# Patient Record
Sex: Female | Born: 1985 | Race: Black or African American | Hispanic: No | Marital: Single | Smoking: Current some day smoker
Health system: Southern US, Community
[De-identification: ages and names within clinical notes are randomized; demographics above are authoritative.]

## PROBLEM LIST (undated history)

## (undated) DIAGNOSIS — E559 Vitamin D deficiency, unspecified: Secondary | ICD-10-CM

## (undated) DIAGNOSIS — D649 Anemia, unspecified: Secondary | ICD-10-CM

---

## 2014-06-29 DIAGNOSIS — L732 Hidradenitis suppurativa: Secondary | ICD-10-CM | POA: Insufficient documentation

## 2014-09-29 DIAGNOSIS — E669 Obesity, unspecified: Secondary | ICD-10-CM | POA: Insufficient documentation

## 2015-11-15 ENCOUNTER — Emergency Department (HOSPITAL_COMMUNITY)
Admission: EM | Admit: 2015-11-15 | Discharge: 2015-11-15 | Disposition: A | Discharge status: Home / Self Care | Payer: Medicaid Other | Attending: Emergency Medicine | Admitting: Emergency Medicine

## 2015-11-15 ENCOUNTER — Encounter (HOSPITAL_COMMUNITY): Payer: Self-pay | Admitting: *Deleted

## 2015-11-15 DIAGNOSIS — L509 Urticaria, unspecified: Secondary | ICD-10-CM

## 2015-11-15 DIAGNOSIS — L500 Allergic urticaria: Secondary | ICD-10-CM | POA: Diagnosis not present

## 2015-11-15 DIAGNOSIS — F17200 Nicotine dependence, unspecified, uncomplicated: Secondary | ICD-10-CM | POA: Insufficient documentation

## 2015-11-15 DIAGNOSIS — T7840XA Allergy, unspecified, initial encounter: Secondary | ICD-10-CM

## 2015-11-15 HISTORY — DX: Vitamin D deficiency, unspecified: E55.9

## 2015-11-15 HISTORY — DX: Anemia, unspecified: D64.9

## 2015-11-15 MED ORDER — PREDNISONE 20 MG PO TABS
60.0000 mg | ORAL_TABLET | Freq: Once | ORAL | Status: AC
  Administered 2015-11-15: 60 mg via ORAL
  Filled 2015-11-15: qty 3

## 2015-11-15 MED ORDER — CETIRIZINE HCL 10 MG PO TABS: 10.0000 mg | ORAL_TABLET | Freq: Every day | ORAL | 0 refills | Status: DC

## 2015-11-15 MED ORDER — PREDNISONE 20 MG PO TABS: 20.0000 mg | ORAL_TABLET | Freq: Two times a day (BID) | ORAL | 0 refills | Status: DC

## 2015-11-15 MED ORDER — FLUTICASONE PROPIONATE 50 MCG/ACT NA SUSP: NASAL | 1 refills | Status: DC

## 2015-11-15 NOTE — ED Triage Notes (Signed)
PT states moved to new house 20 days ago and for the last 15, some part of her face has been swelling, intermittently (ears, then lips, now R eye).  Pt also feels like theres a "knot in her throat".  Airway patent, presently.

## 2015-11-15 NOTE — ED Provider Notes (Signed)
[facility]-EMERGENCY DEPT Provider Note   CSN: [id number] Arrival date & time: 11/15/15  0745     History   Chief Complaint Chief Complaint  Patient presents with  . Allergic Reaction    HPI Susan Fuentes is a 30 y.o. female. She presents for evaluation of facial swelling. States she moved into new house about 3 weeks ago. Within a few days started having episodes where she would notice "things are swollen". When they her lip was swollen. A few days later her ears were both swollen. Seems to come and go. This morning she has some swelling to her right eyelid, and her upper lip. Denies any difficulty breathing. However she states that she had a slight cough this morning and felt "like a lump "in her throat. Her children home and not having symptoms. She has no new exposures. No medications. No detergents, soaps, perfumes, colognes, cleaning products.  No bites or stings. No dyspnea. Has a history of asthma and tried her inhaler this morning states it did not help.  HPI  Past Medical History:  Diagnosis Date  . Anemia   . Vitamin D deficiency     There are no active problems to display for this patient.   History reviewed. No pertinent surgical history.  OB History    No data available       Home Medications    Prior to Admission medications   Medication Sig Start Date End Date Taking? Authorizing Provider  cetirizine (ZYRTEC) 10 MG tablet Take 1 tablet (10 mg total) by mouth daily. 1 po q day prn allergies 11/15/15   Rolland Porter, MD  fluticasone [facility]) 50 MCG/ACT nasal spray 1 spray each nares bid 11/15/15   Rolland Porter, MD  predniSONE (DELTASONE) 20 MG tablet Take 1 tablet (20 mg total) by mouth 2 (two) times daily with a meal. 11/15/15   Rolland Porter, MD    Family History No family history on file.  Social History Social History  Substance Use Topics  . Smoking status: Current Some Day Smoker  . Smokeless tobacco: Never Used  . Alcohol use Yes     Comment: occ      Allergies   Review of patient's allergies indicates no known allergies.   Review of Systems Review of Systems  Constitutional: Negative for appetite change, chills, diaphoresis, fatigue and fever.  HENT: Negative for mouth sores, sore throat and trouble swallowing.        Eyelid and lip swelling  Eyes: Negative for visual disturbance.  Respiratory: Negative for cough, chest tightness, shortness of breath and wheezing.   Cardiovascular: Negative for chest pain.  Gastrointestinal: Negative for abdominal distention, abdominal pain, diarrhea, nausea and vomiting.  Endocrine: Negative for polydipsia, polyphagia and polyuria.  Genitourinary: Negative for dysuria, frequency and hematuria.  Musculoskeletal: Negative for gait problem.  Skin: Negative for color change, pallor and rash.  Neurological: Negative for dizziness, syncope, light-headedness and headaches.  Hematological: Does not bruise/bleed easily.  Psychiatric/Behavioral: Negative for behavioral problems and confusion.     Physical Exam Updated Vital Signs BP 117/85 (BP Location: Right Arm)   Pulse 93   Temp 98.5 F (36.9 C) (Oral)   Resp 18   Ht 5' (1.524 m)   Wt 162 lb (73.5 kg)   LMP 11/12/2015   SpO2 99%   BMI 31.64 kg/m   Physical Exam  Constitutional: She is oriented to person, place, and time. She appears well-developed and well-nourished. No distress.  HENT:  Head: Normocephalic.  Soft tissue swelling to right upper lid. Soft tissue swelling to left upper lip. Posterior pharynx normal. Normal uvula. Neck without stridor. No dyspnea. No conjunctival injection. No swelling of the nasal mucosa. Ears normal.  Eyes: Conjunctivae are normal. Pupils are equal, round, and reactive to light. No scleral icterus.  Neck: Normal range of motion. Neck supple. No thyromegaly present.  Cardiovascular: Normal rate and regular rhythm.  Exam reveals no gallop and no friction rub.   No murmur heard. Pulmonary/Chest: Effort  normal and breath sounds normal. No respiratory distress. She has no wheezes. She has no rales.  Abdominal: Soft. Bowel sounds are normal. She exhibits no distension. There is no tenderness. There is no rebound.  Musculoskeletal: Normal range of motion.  Neurological: She is alert and oriented to person, place, and time.  Skin: Skin is warm and dry. No rash noted.  No urticaria  Psychiatric: She has a normal mood and affect. Her behavior is normal.     ED Treatments / Results  Labs (all labs ordered are listed, but only abnormal results are displayed) Labs Reviewed - No data to display  EKG  EKG Interpretation None       Radiology No results found.  Procedures Procedures (including critical care time)  Medications Ordered in ED Medications  predniSONE (DELTASONE) tablet 60 mg (not administered)     Initial Impression / Assessment and Plan / ED Course  I have reviewed the triage vital signs and the nursing notes.  Pertinent labs & imaging results that were available during my care of the patient were reviewed by me and considered in my medical decision making (see chart for details).  Clinical Course    Given by mouth prednisone. Will try daily Zyrtec, 5 day prednisone, Flonase. Primary care follow-up if not improving ER with any worsening symptoms.  Final Clinical Impressions(s) / ED Diagnoses   Final diagnoses:  Urticaria  Allergic reaction, initial encounter    New Prescriptions New Prescriptions   CETIRIZINE (ZYRTEC) 10 MG TABLET    Take 1 tablet (10 mg total) by mouth daily. 1 po q day prn allergies   FLUTICASONE (FLONASE) 50 MCG/ACT NASAL SPRAY    1 spray each nares bid   PREDNISONE (DELTASONE) 20 MG TABLET    Take 1 tablet (20 mg total) by mouth 2 (two) times daily with a meal.     Rolland PorterMark Carime Dinkel, MD 11/15/15 [phone]

## 2015-11-15 NOTE — ED Notes (Signed)
Pt. Walked back to room by Malachi BondsIda, NT.

## 2015-11-15 NOTE — ED Triage Notes (Signed)
Pt speaking in complete sentences at nurse first. 

## 2015-11-15 NOTE — Discharge Instructions (Signed)
Prednisone prescription for the next 5 days. Claritin daily. Flonase inhaler twice per day. Return to ER with any difficult breathing worsening symptoms. Primary care follow-up. If this becomes a recurrent issue you may need allergy skin testing

## 2016-10-07 ENCOUNTER — Encounter (HOSPITAL_COMMUNITY): Payer: Self-pay | Admitting: Emergency Medicine

## 2016-10-07 ENCOUNTER — Emergency Department (HOSPITAL_COMMUNITY)
Admission: EM | Admit: 2016-10-07 | Discharge: 2016-10-07 | Disposition: A | Discharge status: Home / Self Care | Payer: Medicaid Other | Attending: Emergency Medicine | Admitting: Emergency Medicine

## 2016-10-07 DIAGNOSIS — F17200 Nicotine dependence, unspecified, uncomplicated: Secondary | ICD-10-CM | POA: Diagnosis not present

## 2016-10-07 DIAGNOSIS — R197 Diarrhea, unspecified: Secondary | ICD-10-CM | POA: Diagnosis not present

## 2016-10-07 DIAGNOSIS — R109 Unspecified abdominal pain: Secondary | ICD-10-CM | POA: Diagnosis present

## 2016-10-07 DIAGNOSIS — Z79899 Other long term (current) drug therapy: Secondary | ICD-10-CM | POA: Insufficient documentation

## 2016-10-07 DIAGNOSIS — R112 Nausea with vomiting, unspecified: Secondary | ICD-10-CM | POA: Diagnosis not present

## 2016-10-07 LAB — COMPREHENSIVE METABOLIC PANEL
ALT: 13 U/L — ABNORMAL LOW (ref 14–54)
AST: 20 U/L (ref 15–41)
Albumin: 3.8 g/dL (ref 3.5–5.0)
Alkaline Phosphatase: 42 U/L (ref 38–126)
Anion gap: 8 (ref 5–15)
BILIRUBIN TOTAL: 0.4 mg/dL (ref 0.3–1.2)
BUN: 14 mg/dL (ref 6–20)
CALCIUM: 8.9 mg/dL (ref 8.9–10.3)
CO2: 25 mmol/L (ref 22–32)
Chloride: 106 mmol/L (ref 101–111)
Creatinine, Ser: 0.82 mg/dL (ref 0.44–1.00)
GFR calc Af Amer: >60 mL/min (ref >60)
Glucose, Bld: 92 mg/dL (ref 65–99)
POTASSIUM: 3.5 mmol/L (ref 3.5–5.1)
Sodium: 139 mmol/L (ref 135–145)
Total Protein: 7 g/dL (ref 6.5–8.1)

## 2016-10-07 LAB — URINALYSIS, ROUTINE W REFLEX MICROSCOPIC
Bilirubin Urine: NEGATIVE
Glucose, UA: NEGATIVE mg/dL
Hgb urine dipstick: NEGATIVE
Ketones, ur: NEGATIVE mg/dL
NITRITE: NEGATIVE
PH: 8 (ref 5.0–8.0)
Protein, ur: 100 mg/dL — AB
SPECIFIC GRAVITY, URINE: 1.03 (ref 1.005–1.030)

## 2016-10-07 LAB — CBC
HCT: 35.9 % — ABNORMAL LOW (ref 36.0–46.0)
Hemoglobin: 11.6 g/dL — ABNORMAL LOW (ref 12.0–15.0)
MCH: 27.8 pg (ref 26.0–34.0)
MCHC: 32.3 g/dL (ref 30.0–36.0)
MCV: 85.9 fL (ref 78.0–100.0)
Platelets: 329 10*3/uL (ref 150–400)
RBC: 4.18 MIL/uL (ref 3.87–5.11)
RDW: 13.6 % (ref 11.5–15.5)
WBC: 9.6 10*3/uL (ref 4.0–10.5)

## 2016-10-07 LAB — WET PREP, GENITAL
SPERM: NONE SEEN
TRICH WET PREP: NONE SEEN
YEAST WET PREP: NONE SEEN

## 2016-10-07 LAB — POC URINE PREG, ED: Preg Test, Ur: NEGATIVE

## 2016-10-07 LAB — LIPASE, BLOOD: Lipase: 37 U/L (ref 11–51)

## 2016-10-07 MED ORDER — SUCRALFATE 1 G PO TABS: 1.0000 g | ORAL_TABLET | Freq: Three times a day (TID) | ORAL | 0 refills | Status: DC

## 2016-10-07 MED ORDER — ONDANSETRON 4 MG PO TBDP
ORAL_TABLET | ORAL | Status: AC
  Filled 2016-10-07: qty 1

## 2016-10-07 MED ORDER — ONDANSETRON 4 MG PO TBDP
4.0000 mg | ORAL_TABLET | Freq: Once | ORAL | Status: AC | PRN
  Administered 2016-10-07: 4 mg via ORAL

## 2016-10-07 MED ORDER — ONDANSETRON 8 MG PO TBDP: 8.0000 mg | ORAL_TABLET | Freq: Three times a day (TID) | ORAL | 0 refills | Status: DC | PRN

## 2016-10-07 MED ORDER — GI COCKTAIL ~~LOC~~
30.0000 mL | Freq: Once | ORAL | Status: AC
  Administered 2016-10-07: 30 mL via ORAL
  Filled 2016-10-07: qty 30

## 2016-10-07 NOTE — ED Triage Notes (Signed)
Pt with ab pain, nausea, vomiting and diarrhea after night of heavy alcohol consumption. No blood in emesis. NAD. Pain 6/10. No meds PTA.

## 2016-10-07 NOTE — Discharge Instructions (Signed)
Take carafate as prescribed for abdominal pain. Take zofran for nausea as prescribed as needed. Follow up with family doctor.

## 2016-10-07 NOTE — ED Provider Notes (Signed)
[facility]-EMER[facility]Y DEPT Provider Note   CSN: [id number] Arrival date & time: 10/07/16  1510  By signing my name below, I, Ny'Kea Lewis, attest that this documentation has been prepared under the direction and in the presence of Maybell Misenheimer, PA-C. Electronically Signed: Karren Cobble, ED Scribe. 10/07/16. 6:44 PM.  History   Chief Complaint Chief Complaint  Patient presents with  . Abdominal Pain  . Diarrhea  . Emesis   The history is provided by the patient. No language interpreter was used.   HPI Comments: Susan Fuentes is a 31 y.o. female with a PMHx of anemia, who presents to the Emergency Department complaining of sudden onset, intermittent, lower abdominal pain that began two days ago. She notes associated diarrhea, nausea, and vomiting for the past two days. Pt states last night she was celebrating with her friend and she had some alcohol more than she normally drinks, and since then she has been having continuous diarrhea, nausea, and vomiting. Two days prior to she reports her intial pain began, but it started to improve. After consuming alcohol she reports her symptoms worsened. Her abdominal pain is exacerbated with lying flat and alleviated with sitting straight up. Pt reports her menstrual has not started yet but states her cycles are abnormal. No treatment tried PTA. At this time she states she is not nauseous. She denies dysuria, urinary frequency/ urgency, or vaginal discharge.    Past Medical History:  Diagnosis Date  . Anemia   . Vitamin D deficiency    There are no active problems to display for this patient.  History reviewed. No pertinent surgical history.  OB History    No data available     Home Medications    Prior to Admission medications   Medication Sig Start Date End Date Taking? Authorizing Provider  cetirizine (ZYRTEC) 10 MG tablet Take 1 tablet (10 mg total) by mouth daily. 1 po q day prn allergies 11/15/15   Rolland Porter, MD  fluticasone [facility])  50 MCG/ACT nasal spray 1 spray each nares bid 11/15/15   Rolland Porter, MD  predniSONE (DELTASONE) 20 MG tablet Take 1 tablet (20 mg total) by mouth 2 (two) times daily with a meal. 11/15/15   Rolland Porter, MD   Family History No family history on file.  Social History Social History  Substance Use Topics  . Smoking status: Current Some Day Smoker  . Smokeless tobacco: Never Used  . Alcohol use Yes     Comment: occ    Allergies   Patient has no known allergies.  Review of Systems Review of Systems  Gastrointestinal: Positive for abdominal pain, diarrhea, nausea and vomiting.  Genitourinary: Negative for dysuria, frequency, hematuria, urgency and vaginal discharge.  Neurological: Positive for headaches.   Physical Exam Updated Vital Signs BP 109/65 (BP Location: Right Arm)   Pulse 83   Temp 98.5 F (36.9 C) (Oral)   Resp 16   Wt 168 lb (76.2 kg)   LMP 09/08/2016 (Approximate)   SpO2 100%   BMI 32.81 kg/m   Physical Exam  Constitutional: She is oriented to person, place, and time. She appears well-developed and well-nourished. No distress.  HENT:  Head: Normocephalic.  Eyes: Conjunctivae and EOM are normal.  Neck: Normal range of motion. Neck supple.  Cardiovascular: Normal rate, regular rhythm and normal heart sounds.   Pulmonary/Chest: Effort normal and breath sounds normal. No respiratory distress. She has no wheezes. She has no rales.  Abdominal: Soft. Bowel sounds are normal. She exhibits  no distension. There is tenderness. There is no rebound.  Suprapubic, and epigastric tenderness  Genitourinary:  Genitourinary Comments: Chaperone present. Normal external genitalia. Normal vaginal canal. Small thin white discharge. Cervix is normal, closed. No CMT. No uterine or adnexal tenderness. No masses palpated.    Musculoskeletal: Normal range of motion. She exhibits no edema.  Neurological: She is alert and oriented to person, place, and time.  Skin: Skin is warm and dry.    Psychiatric: She has a normal mood and affect. Her behavior is normal.  Nursing note and vitals reviewed.   ED Treatments / Results  DIAGNOSTIC STUDIES: Oxygen Saturation is 100% on RA, normal by my interpretation.   COORDINATION OF CARE: 6:28 PM-Discussed next steps with pt. Pt verbalized understanding and is agreeable with the plan.   Labs (all labs ordered are listed, but only abnormal results are displayed) Labs Reviewed  COMPREHENSIVE METABOLIC PANEL - Abnormal; Notable for the following:       Result Value   ALT 13 (*)    All other components within normal limits  CBC - Abnormal; Notable for the following:    Hemoglobin 11.6 (*)    HCT 35.9 (*)    All other components within normal limits  LIPASE, BLOOD  URINALYSIS, ROUTINE W REFLEX MICROSCOPIC  POC URINE PREG, ED    EKG  EKG Interpretation None       Radiology No results found.  Procedures Procedures (including critical care time)  Medications Ordered in ED Medications  ondansetron (ZOFRAN-ODT) 4 MG disintegrating tablet (not administered)  ondansetron (ZOFRAN-ODT) disintegrating tablet 4 mg (4 mg Oral Given 10/07/16 1544)     Initial Impression / Assessment and Plan / ED Course  I have reviewed the triage vital signs and the nursing notes.  Pertinent labs & imaging results that were available during my care of the patient were reviewed by me and considered in my medical decision making (see chart for details).     Patient with epigastric and lower abdominal pain. Pelvic exam unremarkable. Urinalysis does not show any signs of infection. She's not pregnant. Labs are normal. Patient improved with Zofran and GI cocktail. She's no longer feeling nauseated and drinking fluids. Will discharge home with Carafate and Zofran. I suspect she may have some irritable bowel, possibly gastroenteritis, possibly gastritis from drinking. Her abdomen is however soft, I do not think she needs any further imaging, no  surgical abdomen. Return precautions discussed. Patient will follow-up with family doctor.  Vitals:   10/07/16 1532  BP: 109/65  Pulse: 83  Resp: 16  Temp: 98.5 F (36.9 C)  TempSrc: Oral  SpO2: 100%  Weight: 76.2 kg (168 lb)     Final Clinical Impressions(s) / ED Diagnoses   Final diagnoses:  Nausea vomiting and diarrhea  Abdominal pain, unspecified abdominal location    New Prescriptions New Prescriptions   ONDANSETRON (ZOFRAN ODT) 8 MG DISINTEGRATING TABLET    Take 1 tablet (8 mg total) by mouth every 8 (eight) hours as needed for nausea or vomiting.   SUCRALFATE (CARAFATE) 1 G TABLET    Take 1 tablet (1 g total) by mouth 4 (four) times daily -  with meals and at bedtime.   I personally performed the services described in this documentation, which was scribed in my presence. The recorded information has been reviewed and is accurate.    Jaynie Crumble, PA-C 10/07/16 1958    Mesner, Barbara Cower, MD 10/07/16 [phone]

## 2016-10-09 LAB — GC/CHLAMYDIA PROBE AMP (~~LOC~~) NOT AT ARMC
Chlamydia: NEGATIVE
Neisseria Gonorrhea: NEGATIVE

## 2017-11-08 ENCOUNTER — Emergency Department (HOSPITAL_COMMUNITY): Payer: Medicaid Other

## 2017-11-08 ENCOUNTER — Emergency Department (HOSPITAL_COMMUNITY)
Admission: EM | Admit: 2017-11-08 | Discharge: 2017-11-08 | Disposition: A | Discharge status: Home / Self Care | Payer: Medicaid Other | Attending: Emergency Medicine | Admitting: Emergency Medicine

## 2017-11-08 ENCOUNTER — Encounter (HOSPITAL_COMMUNITY): Payer: Self-pay | Admitting: Emergency Medicine

## 2017-11-08 DIAGNOSIS — R109 Unspecified abdominal pain: Secondary | ICD-10-CM | POA: Diagnosis present

## 2017-11-08 DIAGNOSIS — N12 Tubulo-interstitial nephritis, not specified as acute or chronic: Secondary | ICD-10-CM | POA: Diagnosis not present

## 2017-11-08 DIAGNOSIS — Z79899 Other long term (current) drug therapy: Secondary | ICD-10-CM | POA: Insufficient documentation

## 2017-11-08 DIAGNOSIS — F17290 Nicotine dependence, other tobacco product, uncomplicated: Secondary | ICD-10-CM | POA: Insufficient documentation

## 2017-11-08 LAB — COMPREHENSIVE METABOLIC PANEL
ALBUMIN: 4.5 g/dL (ref 3.5–5.0)
ALT: 13 U/L (ref 0–44)
ANION GAP: 11 (ref 5–15)
AST: 17 U/L (ref 15–41)
Alkaline Phosphatase: 59 U/L (ref 38–126)
BILIRUBIN TOTAL: 0.9 mg/dL (ref 0.3–1.2)
BUN: 10 mg/dL (ref 6–20)
CALCIUM: 9.6 mg/dL (ref 8.9–10.3)
CO2: 27 mmol/L (ref 22–32)
Chloride: 101 mmol/L (ref 98–111)
Creatinine, Ser: 0.94 mg/dL (ref 0.44–1.00)
GFR calc Af Amer: >60 mL/min (ref >60)
GLUCOSE: 100 mg/dL — AB (ref 70–99)
POTASSIUM: 3.6 mmol/L (ref 3.5–5.1)
Sodium: 139 mmol/L (ref 135–145)
TOTAL PROTEIN: 8.8 g/dL — AB (ref 6.5–8.1)

## 2017-11-08 LAB — URINALYSIS, ROUTINE W REFLEX MICROSCOPIC
BILIRUBIN URINE: NEGATIVE
GLUCOSE, UA: NEGATIVE mg/dL
Ketones, ur: 20 mg/dL — AB
NITRITE: POSITIVE — AB
PH: 6 (ref 5.0–8.0)
Protein, ur: 30 mg/dL — AB
SPECIFIC GRAVITY, URINE: 1.017 (ref 1.005–1.030)
WBC, UA: >50 WBC/hpf — ABNORMAL HIGH (ref 0–5)

## 2017-11-08 LAB — LIPASE, BLOOD: Lipase: 27 U/L (ref 11–51)

## 2017-11-08 LAB — CBC
HEMATOCRIT: 39.2 % (ref 36.0–46.0)
Hemoglobin: 12.7 g/dL (ref 12.0–15.0)
MCH: 28.3 pg (ref 26.0–34.0)
MCHC: 32.4 g/dL (ref 30.0–36.0)
MCV: 87.3 fL (ref 78.0–100.0)
Platelets: 345 10*3/uL (ref 150–400)
RBC: 4.49 MIL/uL (ref 3.87–5.11)
RDW: 13 % (ref 11.5–15.5)
WBC: 11 10*3/uL — ABNORMAL HIGH (ref 4.0–10.5)

## 2017-11-08 LAB — I-STAT BETA HCG BLOOD, ED (MC, WL, AP ONLY): I-stat hCG, quantitative: <5 m[IU]/mL (ref <5)

## 2017-11-08 MED ORDER — SODIUM CHLORIDE 0.9 % IV BOLUS
1000.0000 mL | Freq: Once | INTRAVENOUS | Status: AC
Start: 2017-11-08 — End: 2017-11-08
  Administered 2017-11-08: 1000 mL via INTRAVENOUS

## 2017-11-08 MED ORDER — IOPAMIDOL (ISOVUE-300) INJECTION 61%
INTRAVENOUS | Status: AC
  Filled 2017-11-08: qty 100

## 2017-11-08 MED ORDER — ONDANSETRON HCL 4 MG/2ML IJ SOLN
4.0000 mg | Freq: Once | INTRAMUSCULAR | Status: AC
  Administered 2017-11-08: 4 mg via INTRAVENOUS
  Filled 2017-11-08: qty 2

## 2017-11-08 MED ORDER — ONDANSETRON 4 MG PO TBDP: 4.0000 mg | ORAL_TABLET | Freq: Three times a day (TID) | ORAL | 0 refills | Status: DC | PRN

## 2017-11-08 MED ORDER — SULFAMETHOXAZOLE-TRIMETHOPRIM 800-160 MG PO TABS: 1.0000 | ORAL_TABLET | Freq: Two times a day (BID) | ORAL | 0 refills | Status: AC

## 2017-11-08 MED ORDER — MORPHINE SULFATE (PF) 4 MG/ML IV SOLN
4.0000 mg | Freq: Once | INTRAVENOUS | Status: AC
  Administered 2017-11-08: 4 mg via INTRAVENOUS
  Filled 2017-11-08: qty 1

## 2017-11-08 MED ORDER — IOPAMIDOL (ISOVUE-300) INJECTION 61%
100.0000 mL | Freq: Once | INTRAVENOUS | Status: AC | PRN
  Administered 2017-11-08: 100 mL via INTRAVENOUS

## 2017-11-08 NOTE — ED Provider Notes (Signed)
[facility]-EMERGENCY DEPT Provider Note   CSN: [id number] Arrival date & time: 11/08/17  1214   History   Chief Complaint Chief Complaint  Patient presents with  . Abdominal Pain  . Emesis    HPI Susan Fuentes is a 32 y.o. female with a past medical history significant for chronic anemia who presents for evaluation of flank pain. Pain started 2 days ago. Pain has been constant in nature. Pain does not radiate. Rates her pain a 8/10. Admits to dysuria and subjective fever x 1 day. States she has had non-bloody, non-biliary emesis for 1 day. She is able to tolerate PO liquids without difficulty. Denies chills, headache, chest pain, SOB, vaginal discharge, diarrhea, constipation. Denies concern for STDs. Has taken Tylenol for her pain, with [place] relief of symptoms.  HPI  Past Medical History:  Diagnosis Date  . Anemia   . Vitamin D deficiency     There are no active problems to display for this patient.   History reviewed. No pertinent surgical history.   OB History   None      Home Medications    Prior to Admission medications   Medication Sig Start Date End Date Taking? Authorizing Provider  aspirin-acetaminophen-caffeine (EXCEDRIN MIGRAINE) [phone] MG tablet Take 2 tablets by mouth every 6 (six) hours as needed for headache or migraine.   Yes [provider]  ondansetron (ZOFRAN ODT) 4 MG disintegrating tablet Take 1 tablet (4 mg total) by mouth every 8 (eight) hours as needed for nausea or vomiting. 11/08/17   Ketina Mars A, PA-C  sulfamethoxazole-trimethoprim (BACTRIM DS,SEPTRA DS) 800-160 MG tablet Take 1 tablet by mouth 2 (two) times daily for 14 days. 11/08/17 11/22/17  Keita Valley A, PA-C    Family History No family history on file.  Social History Social History   Tobacco Use  . Smoking status: Current Some Day Smoker    Types: Cigars  . Smokeless tobacco: Never Used  Substance Use Topics  . Alcohol use: Yes   Comment: occ  . Drug use: No     Allergies   Promethazine   Review of Systems Review of Systems  Constitutional: Positive for fever. Negative for activity change, appetite change, chills, diaphoresis and fatigue.  Respiratory: Negative.   Cardiovascular: Negative.   Gastrointestinal: Positive for abdominal pain, nausea and vomiting. Negative for abdominal distention, blood in stool, constipation and diarrhea.  Genitourinary: Positive for dysuria and flank pain. Negative for decreased urine volume, difficulty urinating, hematuria, menstrual problem, pelvic pain, urgency, vaginal bleeding, vaginal discharge and vaginal pain.     Physical Exam Updated Vital Signs BP (!) 131/100   Pulse 91   Resp 16   LMP 11/04/2017 Comment: neg hcg   SpO2 100%   Physical Exam  Constitutional: She appears well-developed and well-nourished.  Non-toxic appearance. She does not appear ill. No distress.  HENT:  Head: Atraumatic.  Mouth/Throat: Oropharynx is clear and moist.  Eyes: Pupils are equal, round, and reactive to light.  Neck: Normal range of motion. Neck supple.  Cardiovascular: Normal rate, regular rhythm, normal heart sounds and intact distal pulses.  No murmur heard. Pulmonary/Chest: Effort normal and breath sounds normal. No stridor. No respiratory distress. She has no wheezes. She has no rhonchi. She has no rales. She exhibits no tenderness.  Abdominal: Soft. Normal appearance and bowel sounds are normal. She exhibits no distension and no mass. There is no hepatosplenomegaly. There is guarding and CVA tenderness. There is no rigidity, no rebound,  no tenderness at McBurney's point and negative Murphy's sign.  Musculoskeletal: Normal range of motion.  Neurological: She is alert.  Skin: Skin is warm and dry. She is not diaphoretic.  Psychiatric: She has a normal mood and affect.  Nursing note and vitals reviewed.    ED Treatments / Results  Labs (all labs ordered are listed, but  only abnormal results are displayed) Labs Reviewed  COMPREHENSIVE METABOLIC PANEL - Abnormal; Notable for the following components:      Result Value   Glucose, Bld 100 (*)    Total Protein 8.8 (*)    All other components within normal limits  CBC - Abnormal; Notable for the following components:   WBC 11.0 (*)    All other components within normal limits  URINALYSIS, ROUTINE W REFLEX MICROSCOPIC - Abnormal; Notable for the following components:   APPearance HAZY (*)    Hgb urine dipstick MODERATE (*)    Ketones, ur 20 (*)    Protein, ur 30 (*)    Nitrite POSITIVE (*)    Leukocytes, UA MODERATE (*)    WBC, UA >50 (*)    Bacteria, UA MANY (*)    All other components within normal limits  LIPASE, BLOOD  I-STAT BETA HCG BLOOD, ED ([facility], [facility], [facility] ONLY)    EKG None  Radiology Ct Abdomen Pelvis W Contrast  Result Date: 11/08/2017 CLINICAL DATA:  Nausea, vomiting, abdominal pain for 2 days EXAM: CT ABDOMEN AND PELVIS WITH CONTRAST TECHNIQUE: Multidetector CT imaging of the abdomen and pelvis was performed using the standard protocol following bolus administration of intravenous contrast. CONTRAST:  ISOVUE-300 IOPAMIDOL (ISOVUE-300) INJECTION 61% COMPARISON:  None. FINDINGS: Lower chest: The lung bases are clear. The heart is within normal limits in size. Hepatobiliary: The liver enhances with no focal abnormality and no ductal dilatation is seen. Calcified gallstones are seen. Pancreas: The pancreas is normal in size and the pancreatic duct is not dilated. Spleen: The spleen is unremarkable. Adrenals/Urinary Tract: The adrenal glands appear normal. The kidneys enhance and there is a cyst in the mid upper left kidney laterally. No renal calculus is seen and there is no evidence of hydronephrosis. The ureters appear normal in caliber. The urinary bladder is not well distended but no abnormality is seen. Stomach/Bowel: The stomach is largely decompressed. No abnormality of small bowel is noted.  The colon is largely decompressed. The terminal ileum and the appendix are unremarkable. Vascular/Lymphatic: The abdominal aorta is normal in caliber. No adenopathy is seen. Reproductive: The uterus is somewhat prominent and there may be a fibroid present anteriorly into the right near the fundus of approximately 19 mm in diameter. Apparent small ovarian follicles are noted bilaterally. No significant free fluid is seen within the pelvis. Other: No abdominal wall hernia is seen. Musculoskeletal: The lumbar vertebrae are in normal alignment with normal intervertebral disc spaces. IMPRESSION: 1. No explanation for the patient's symptoms is seen. 2. No renal or ureteral calculi are seen. 3. Small ovarian follicles.  No free fluid. 4. The appendix and terminal ileum are unremarkable. Electronically Signed   By: Dwyane Dee M.D.   On: 11/08/2017 14:34    Procedures Procedures (including critical care time)  Medications Ordered in ED Medications  iopamidol (ISOVUE-300) 61 % injection (has no administration in time range)  sodium chloride 0.9 % bolus 1,000 mL (0 mLs Intravenous Stopped 11/08/17 1535)  ondansetron (ZOFRAN) injection 4 mg (4 mg Intravenous Given 11/08/17 1431)  morphine 4 MG/ML injection 4 mg (  4 mg Intravenous Given 11/08/17 1432)  iopamidol (ISOVUE-300) 61 % injection 100 mL (100 mLs Intravenous Contrast Given 11/08/17 1414)     Initial Impression / Assessment and Plan / ED Course  I have reviewed the triage vital signs and the nursing notes as well as past medical history.  Pertinent labs & imaging results that were available during my care of the patient were reviewed by me and considered in my medical decision making (see chart for details).  32 year old otherwise healthy female presents for acute onset flank/abdominal pain with subjective fever and dysuria. Guarding on exam with significant CVA tenderness on left. Concern for complicated pyelonephritis, abscess. Will obtain labs, urine,  imaging, fluids and manage pain and re-evalaute.   CBC with Leukocytosis at 11.0. Urine with Moderate Hbg, on current cycle, Positive Nitrite, Moderate Leukocytosis with many Bacteria. CT negative for abscess. Symptoms likely from Pyelonephritis. Pain and nausea improved with meds. Fluid given. Tachycardia improved with fluids. Patient is nontoxic, nonseptic appearing, in no apparent distress. Patient does not meet the SIRS or Sepsis criteria.  On repeat exam patient does not have a surgical abdomin and there are no peritoneal signs.  No indication of appendicitis, bowel obstruction, bowel perforation, cholecystitis, diverticulitis, PID or ectopic pregnancy.  Will PO trial and dc if able to tolerate.  Able to tolerate PO intake in ED. Will dc home with Bactrim and Zofran. Discussed strict return precautions with patient. Instructions for follow-up with their primary care physician. Patient expresses understanding and agrees with plan.    Final Clinical Impressions(s) / ED Diagnoses   Final diagnoses:  Pyelonephritis    ED Discharge Orders         Ordered    sulfamethoxazole-trimethoprim (BACTRIM DS,SEPTRA DS) 800-160 MG tablet  2 times daily     11/08/17 1550    ondansetron (ZOFRAN ODT) 4 MG disintegrating tablet  Every 8 hours PRN     11/08/17 1601           Dorianne Perret A, PA-C 11/08/17 1605    Gerhard MunchLockwood, Robert, MD 11/12/17 1606

## 2017-11-08 NOTE — ED Triage Notes (Signed)
Pt c/o lateral abd pains with n/v x 2 days. Denies bowel or urinary problems.

## 2017-11-08 NOTE — Discharge Instructions (Signed)
You have been diagnosed with Pyelonephritis, which is a urinary tract infection that goes to your kidneys.  You have been prescribed an antibiotic. Please take as prescribed. You have also been prescribed Zofran for your nausea. Please follow up with your primary care provider in 2 days. Return to the ED with any new or worsening symptoms such as:  Contact a health care provider if: Your symptoms do not get better after 2 days of treatment. Your symptoms get worse. You have a fever. Get help right away if: You are unable to take your antibiotics or fluids. You have shaking chills. You vomit. You have severe flank or back pain. You have extreme weakness or fainting.

## 2017-11-08 NOTE — ED Notes (Signed)
Pt to restroom

## 2017-12-25 ENCOUNTER — Encounter: Payer: Self-pay | Admitting: Obstetrics

## 2017-12-25 ENCOUNTER — Ambulatory Visit: Payer: Medicaid Other | Admitting: Obstetrics

## 2017-12-25 VITALS — BP 111/78 | HR 98 | Ht 60.0 in | Wt 164.8 lb

## 2017-12-25 DIAGNOSIS — R87610 Atypical squamous cells of undetermined significance on cytologic smear of cervix (ASC-US): Secondary | ICD-10-CM

## 2017-12-25 NOTE — Progress Notes (Signed)
Patient ID: Susan Fuentes, female   DOB: 1985/02/28, 32 y.o.   MRN: [id number]  No chief complaint on file.   HPI Susan Fuentes is a 32 y.o. female.  HISTORY OF ABNORMAL PAP:  ASCUS with NEGATIVE HIGH RISK HPV HPI  Past Medical History:  Diagnosis Date  . Anemia   . Vitamin D deficiency     History reviewed. No pertinent surgical history.  History reviewed. No pertinent family history.  Social History Social History   Tobacco Use  . Smoking status: Current Some Day Smoker    Types: Cigars  . Smokeless tobacco: Never Used  Substance Use Topics  . Alcohol use: Yes    Comment: occ  . Drug use: No    Allergies  Allergen Reactions  . Promethazine Hives    Rash    Current Outpatient Medications  Medication Sig Dispense Refill  . Cholecalciferol (VITAMIN D3) 1000 units CAPS Take by mouth.    . ferrous sulfate 325 (65 FE) MG EC tablet Take 325 mg by mouth 3 (three) times daily with meals.    Marland Kitchen aspirin-acetaminophen-caffeine (EXCEDRIN MIGRAINE) 250-250-65 MG tablet Take 2 tablets by mouth every 6 (six) hours as needed for headache or migraine.    . ondansetron (ZOFRAN ODT) 4 MG disintegrating tablet Take 1 tablet (4 mg total) by mouth every 8 (eight) hours as needed for nausea or vomiting. (Patient not taking: Reported on 12/25/2017) 20 tablet 0   No current facility-administered medications for this visit.     Review of Systems Review of Systems Constitutional: negative for fatigue and weight loss Respiratory: negative for cough and wheezing Cardiovascular: negative for chest pain, fatigue and palpitations Gastrointestinal: negative for abdominal pain and change in bowel habits Genitourinary:negative Integument/breast: negative for nipple discharge Musculoskeletal:negative for myalgias Neurological: negative for gait problems and tremors Behavioral/Psych: negative for abusive relationship, depression Endocrine: negative for temperature intolerance      Blood pressure  111/78, pulse 98, height 5' (1.524 m), weight 164 lb 12.8 oz (74.8 kg).  Physical Exam Physical Exam:  DEFERRED >50% of 15 min visit spent on counseling and coordination of care.   Data Reviewed Pap Smear  Assessment     1. ASCUS of cervix with negative high risk HPV    Plan    REPEAT PAP IN 1 YEAR   No orders of the defined types were placed in this encounter.  No orders of the defined types were placed in this encounter.   Brock Bad MD 12-25-2017       Pt is here for referral from PCP for abnormal pap. Pt is not currently SA, does not desire contraception.

## 2019-09-25 ENCOUNTER — Emergency Department (HOSPITAL_COMMUNITY)
Admission: EM | Admit: 2019-09-25 | Discharge: 2019-09-26 | Disposition: A | Discharge status: Home / Self Care | Payer: Medicaid Other | Attending: Emergency Medicine | Admitting: Emergency Medicine

## 2019-09-25 ENCOUNTER — Emergency Department (HOSPITAL_COMMUNITY)
Admission: EM | Admit: 2019-09-25 | Discharge: 2019-09-25 | Discharge status: Against Medical Advice | Payer: Medicaid Other

## 2019-09-25 ENCOUNTER — Encounter (HOSPITAL_COMMUNITY): Payer: Self-pay | Admitting: Emergency Medicine

## 2019-09-25 ENCOUNTER — Other Ambulatory Visit: Payer: Self-pay

## 2019-09-25 DIAGNOSIS — N939 Abnormal uterine and vaginal bleeding, unspecified: Secondary | ICD-10-CM | POA: Diagnosis not present

## 2019-09-25 DIAGNOSIS — U071 COVID-19: Secondary | ICD-10-CM | POA: Diagnosis not present

## 2019-09-25 DIAGNOSIS — Z7982 Long term (current) use of aspirin: Secondary | ICD-10-CM | POA: Diagnosis not present

## 2019-09-25 DIAGNOSIS — R509 Fever, unspecified: Secondary | ICD-10-CM | POA: Diagnosis present

## 2019-09-25 DIAGNOSIS — F17290 Nicotine dependence, other tobacco product, uncomplicated: Secondary | ICD-10-CM | POA: Diagnosis not present

## 2019-09-25 DIAGNOSIS — R1030 Lower abdominal pain, unspecified: Secondary | ICD-10-CM

## 2019-09-25 LAB — CBC WITH DIFFERENTIAL/PLATELET
Abs Immature Granulocytes: 0.01 10*3/uL (ref 0.00–0.07)
Basophils Absolute: 0 10*3/uL (ref 0.0–0.1)
Basophils Relative: 0 %
Eosinophils Absolute: 0 10*3/uL (ref 0.0–0.5)
Eosinophils Relative: 0 %
HCT: 43.7 % (ref 36.0–46.0)
Hemoglobin: 14.2 g/dL (ref 12.0–15.0)
Immature Granulocytes: 0 %
Lymphocytes Relative: 32 %
Lymphs Abs: 1.6 10*3/uL (ref 0.7–4.0)
MCH: 27.6 pg (ref 26.0–34.0)
MCHC: 32.5 g/dL (ref 30.0–36.0)
MCV: 84.9 fL (ref 80.0–100.0)
Monocytes Absolute: 0.2 10*3/uL (ref 0.1–1.0)
Monocytes Relative: 3 %
Neutro Abs: 3.2 10*3/uL (ref 1.7–7.7)
Neutrophils Relative %: 65 %
Platelets: 233 10*3/uL (ref 150–400)
RBC: 5.15 MIL/uL — ABNORMAL HIGH (ref 3.87–5.11)
RDW: 13.4 % (ref 11.5–15.5)
WBC: 5 10*3/uL (ref 4.0–10.5)
nRBC: 0 % (ref 0.0–0.2)

## 2019-09-25 LAB — COMPREHENSIVE METABOLIC PANEL
ALT: 27 U/L (ref 0–44)
AST: 38 U/L (ref 15–41)
Albumin: 4.2 g/dL (ref 3.5–5.0)
Alkaline Phosphatase: 34 U/L — ABNORMAL LOW (ref 38–126)
Anion gap: 11 (ref 5–15)
BUN: 16 mg/dL (ref 6–20)
CO2: 21 mmol/L — ABNORMAL LOW (ref 22–32)
Calcium: 8.7 mg/dL — ABNORMAL LOW (ref 8.9–10.3)
Chloride: 102 mmol/L (ref 98–111)
Creatinine, Ser: 1.08 mg/dL — ABNORMAL HIGH (ref 0.44–1.00)
GFR calc Af Amer: >60 mL/min (ref >60)
GFR calc non Af Amer: >60 mL/min (ref >60)
Glucose, Bld: 134 mg/dL — ABNORMAL HIGH (ref 70–99)
Potassium: 3.7 mmol/L (ref 3.5–5.1)
Sodium: 134 mmol/L — ABNORMAL LOW (ref 135–145)
Total Bilirubin: 0.5 mg/dL (ref 0.3–1.2)
Total Protein: 8.7 g/dL — ABNORMAL HIGH (ref 6.5–8.1)

## 2019-09-25 LAB — LACTIC ACID, PLASMA: Lactic Acid, Venous: 1.5 mmol/L (ref 0.5–1.9)

## 2019-09-25 MED ORDER — ACETAMINOPHEN 325 MG PO TABS
650.0000 mg | ORAL_TABLET | Freq: Once | ORAL | Status: AC | PRN
  Administered 2019-09-26: 650 mg via ORAL
  Filled 2019-09-25 (×2): qty 2

## 2019-09-25 MED ORDER — LACTATED RINGERS IV BOLUS
1000.0000 mL | Freq: Once | INTRAVENOUS | Status: AC
  Administered 2019-09-26: 1000 mL via INTRAVENOUS

## 2019-09-25 NOTE — ED Triage Notes (Signed)
Patient c/o vaginal bleeding and RLQ pain x5 days after getting depot injection. Febrile and tachycardic in triage. Denies N/V/D.

## 2019-09-26 ENCOUNTER — Emergency Department (HOSPITAL_COMMUNITY): Payer: Medicaid Other

## 2019-09-26 ENCOUNTER — Telehealth: Payer: Self-pay | Admitting: Nurse Practitioner

## 2019-09-26 ENCOUNTER — Encounter (HOSPITAL_COMMUNITY): Payer: Self-pay

## 2019-09-26 LAB — WET PREP, GENITAL
Sperm: NONE SEEN
Trich, Wet Prep: NONE SEEN
WBC, Wet Prep HPF POC: NONE SEEN
Yeast Wet Prep HPF POC: NONE SEEN

## 2019-09-26 LAB — LIPASE, BLOOD: Lipase: 34 U/L (ref 11–51)

## 2019-09-26 LAB — SARS CORONAVIRUS 2 BY RT PCR (HOSPITAL ORDER, PERFORMED IN ~~LOC~~ HOSPITAL LAB): SARS Coronavirus 2: POSITIVE — AB

## 2019-09-26 MED ORDER — KETOROLAC TROMETHAMINE 30 MG/ML IJ SOLN
30.0000 mg | Freq: Once | INTRAMUSCULAR | Status: AC
  Administered 2019-09-26: 30 mg via INTRAVENOUS
  Filled 2019-09-26: qty 1

## 2019-09-26 MED ORDER — PREDNISONE 20 MG PO TABS: ORAL_TABLET | ORAL | 0 refills | Status: DC

## 2019-09-26 MED ORDER — IOHEXOL 300 MG/ML  SOLN
100.0000 mL | Freq: Once | INTRAMUSCULAR | Status: AC | PRN
  Administered 2019-09-26: 100 mL via INTRAVENOUS

## 2019-09-26 MED ORDER — ONDANSETRON HCL 4 MG/2ML IJ SOLN
4.0000 mg | Freq: Once | INTRAMUSCULAR | Status: AC
  Administered 2019-09-26: 4 mg via INTRAVENOUS
  Filled 2019-09-26: qty 2

## 2019-09-26 MED ORDER — SODIUM CHLORIDE 0.9 % IV BOLUS
1000.0000 mL | Freq: Once | INTRAVENOUS | Status: AC
  Administered 2019-09-26: 1000 mL via INTRAVENOUS

## 2019-09-26 MED ORDER — SODIUM CHLORIDE 0.9 % IV BOLUS: 1000.0000 mL | Freq: Once | INTRAVENOUS | Status: DC

## 2019-09-26 MED ORDER — AZITHROMYCIN 250 MG PO TABS: ORAL_TABLET | ORAL | 0 refills | Status: DC

## 2019-09-26 MED ORDER — SODIUM CHLORIDE (PF) 0.9 % IJ SOLN
INTRAMUSCULAR | Status: AC
  Filled 2019-09-26: qty 50

## 2019-09-26 NOTE — ED Provider Notes (Signed)
[facility]-EMERGENCY DEPT Provider Note   CSN: [id number] Arrival date & time: 09/25/19  1922   Time seen 12:03 AM  History Chief Complaint  Patient presents with  . Abdominal Pain  . Vaginal Bleeding  . Fever    Susan Fuentes is a 34 y.o. female.  HPI   Patient states she has been on Depo-Provera injections for the past 2 years.  The last one was a month ago.  She states July 31 she started having vaginal bleeding for the first time in 2 years.  She states she had to change her pad every 45 minutes.  She also started getting right lower quadrant pain about the same time.  She states the abdominal pain comes and goes and lasts 1 to 2 minutes at a time.  She describes it as sharp.  She states standing up and walking makes the pain worse, sleeping and laying on her side can make it feel better.  She did not realize she was having fever until she was triaged.  She has had nausea and dry heaves without vomiting.  She denies diarrhea.  She denies dysuria or frequency.  She states she has not been able to eat for the past 5 days.  She states she feels dizzy and lightheaded on standing for the past couple of days.  She also describes decreased urinary output.  She denies cough, rhinorrhea, or sore throat.  Patient is G5, P3 Ab0.  PCP [facility] Bryson Dames, Earl Gala  Past Medical History:  Diagnosis Date  . Anemia   . Vitamin D deficiency     There are no problems to display for this patient.   History reviewed. No pertinent surgical history.   OB History   No obstetric history on file.     No family history on file.  Social History   Tobacco Use  . Smoking status: Current Some Day Smoker    Types: Cigars  . Smokeless tobacco: Never Used  Vaping Use  . Vaping Use: Every day  Substance Use Topics  . Alcohol use: Yes    Comment: occ  . Drug use: No    Home Medications Prior to Admission medications   Medication Sig Start Date End Date Taking? Authorizing  Provider  aspirin-acetaminophen-caffeine (EXCEDRIN MIGRAINE) [phone] MG tablet Take 2 tablets by mouth every 6 (six) hours as needed for headache or migraine.   Yes [provider]  azithromycin (ZITHROMAX Z-PAK) 250 MG tablet Take 2 po the first day then once a day for the next 4 days. 09/26/19   Devoria Albe, MD  ondansetron (ZOFRAN ODT) 4 MG disintegrating tablet Take 1 tablet (4 mg total) by mouth every 8 (eight) hours as needed for nausea or vomiting. Patient not taking: Reported on 12/25/2017 11/08/17   Henderly, Britni A, PA-C  predniSONE (DELTASONE) 20 MG tablet Take 3 po QD x 3d , then 2 po QD x 3d then 1 po QD x 3d 09/26/19   Devoria Albe, MD    Allergies    Promethazine  Review of Systems   Review of Systems  All other systems reviewed and are negative.   Physical Exam Updated Vital Signs BP 95/64   Pulse (!) 113   Temp 99.3 F (37.4 C) (Oral)   Resp (!) 23   Ht 5' (1.524 m)   Wt 84.8 kg   SpO2 98%   BMI 36.52 kg/m   Physical Exam Vitals and nursing note reviewed. Exam conducted with a  chaperone present.  Constitutional:      General: She is in acute distress.     Appearance: Normal appearance. She is obese.  HENT:     Head: Normocephalic and atraumatic.     Right Ear: External ear normal.     Left Ear: External ear normal.     Nose: Nose normal.     Mouth/Throat:     Mouth: Mucous membranes are dry.     Pharynx: No oropharyngeal exudate or posterior oropharyngeal erythema.  Eyes:     General: No scleral icterus.    Extraocular Movements: Extraocular movements intact.     Conjunctiva/sclera: Conjunctivae normal.     Pupils: Pupils are equal, round, and reactive to light.  Cardiovascular:     Rate and Rhythm: Regular rhythm. Tachycardia present.  Pulmonary:     Effort: Pulmonary effort is normal. No respiratory distress.     Breath sounds: Normal breath sounds.  Abdominal:     Tenderness: There is abdominal tenderness. There is no guarding or rebound.         Comments: Patient is tender in the right lower quadrant however she appears in she states she is more tender in the right upper quadrant.  Genitourinary:    General: Normal vulva.     Exam position: Lithotomy position.     Comments: Patient has normal external genitalia.  There is some blood in the vaginal vault.  She has some blood smeared on her proximal inner thighs.  Her cervix appears normal.  On bimanual her uterus and left adnexa are nontender, she has some mild tenderness on the right.  She states her pain has improved after she got the Toradol. Musculoskeletal:        General: Normal range of motion.     Cervical back: Normal range of motion.  Skin:    General: Skin is warm and dry.     Findings: No rash.  Neurological:     General: No focal deficit present.     Mental Status: She is alert and oriented to person, place, and time.     Cranial Nerves: No cranial nerve deficit.  Psychiatric:        Mood and Affect: Mood normal.        Behavior: Behavior normal.        Thought Content: Thought content normal.     ED Results / Procedures / Treatments   Labs (all labs ordered are listed, but only abnormal results are displayed) Results for orders placed or performed during the hospital encounter of 09/25/19  SARS Coronavirus 2 by RT PCR (hospital order, performed in [facility]) Nasopharyngeal Nasopharyngeal Swab   Specimen: Nasopharyngeal Swab  Result Value Ref Range   SARS Coronavirus 2 POSITIVE (A) NEGATIVE  Wet prep, genital   Specimen: PATH Cytology Ancillary Only  Result Value Ref Range   Yeast Wet Prep HPF POC NONE SEEN NONE SEEN   Trich, Wet Prep NONE SEEN NONE SEEN   Clue Cells Wet Prep HPF POC PRESENT (A) NONE SEEN   WBC, Wet Prep HPF POC NONE SEEN NONE SEEN   Sperm NONE SEEN   Lactic acid, plasma  Result Value Ref Range   Lactic Acid, Venous 1.5 0.5 - 1.9 mmol/L  Comprehensive metabolic panel  Result Value Ref Range   Sodium 134 (L) 135 -  145 mmol/L   Potassium 3.7 3.5 - 5.1 mmol/L   Chloride 102 98 - 111 mmol/L   CO2 21 (L)  22 - 32 mmol/L   Glucose, Bld 134 (H) 70 - 99 mg/dL   BUN 16 6 - 20 mg/dL   Creatinine, Ser 4.28 (H) 0.44 - 1.00 mg/dL   Calcium 8.7 (L) 8.9 - 10.3 mg/dL   Total Protein 8.7 (H) 6.5 - 8.1 g/dL   Albumin 4.2 3.5 - 5.0 g/dL   AST 38 15 - 41 U/L   ALT 27 0 - 44 U/L   Alkaline Phosphatase 34 (L) 38 - 126 U/L   Total Bilirubin 0.5 0.3 - 1.2 mg/dL   GFR calc non Af Amer >60 >60 mL/min   GFR calc Af Amer >60 >60 mL/min   Anion gap 11 5 - 15  CBC with Differential  Result Value Ref Range   WBC 5.0 4.0 - 10.5 K/uL   RBC 5.15 (H) 3.87 - 5.11 MIL/uL   Hemoglobin 14.2 12.0 - 15.0 g/dL   HCT 76.8 36 - 46 %   MCV 84.9 80.0 - 100.0 fL   MCH 27.6 26.0 - 34.0 pg   MCHC 32.5 30.0 - 36.0 g/dL   RDW 11.5 72.6 - 20.3 %   Platelets 233 150 - 400 K/uL   nRBC 0.0 0.0 - 0.2 %   Neutrophils Relative % 65 %   Neutro Abs 3.2 1.7 - 7.7 K/uL   Lymphocytes Relative 32 %   Lymphs Abs 1.6 0.7 - 4.0 K/uL   Monocytes Relative 3 %   Monocytes Absolute 0.2 0 - 1 K/uL   Eosinophils Relative 0 %   Eosinophils Absolute 0.0 0 - 0 K/uL   Basophils Relative 0 %   Basophils Absolute 0.0 0 - 0 K/uL   Immature Granulocytes 0 %   Abs Immature Granulocytes 0.01 0.00 - 0.07 K/uL  Lipase, blood  Result Value Ref Range   Lipase 34 11 - 51 U/L   Laboratory interpretation all normal except nonfasting hyperglycemia    EKG EKG Interpretation  Date/Time:  Thursday September 25 2019 19:40:56 EDT Ventricular Rate:  136 PR Interval:    QRS Duration: 75 QT Interval:  275 QTC Calculation: 414 R Axis:   89 Text Interpretation: Sinus tachycardia Borderline repolarization abnormality No old tracing to compare Confirmed by Devoria Albe ([id number]) on 09/25/2019 11:57:31 PM   Radiology CT Abdomen Pelvis W Contrast  Result Date: 09/26/2019 CLINICAL DATA:  Right lower quadrant a abdominal pain with suspected appendicitis. Positive COVID EXAM:  CT ABDOMEN AND PELVIS WITH CONTRAST TECHNIQUE: Multidetector CT imaging of the abdomen and pelvis was performed using the standard protocol following bolus administration of intravenous contrast. CONTRAST:  OMNIPAQUE IOHEXOL 300 MG/ML  SOLN COMPARISON:  11/08/2017 FINDINGS: Lower chest: Lung bases demonstrate nodular airspace infiltrates with air bronchograms compatible with COVID pneumonia. No pleural effusions. Hepatobiliary: Diffuse fatty infiltration of the liver. Gallbladder and bile ducts are unremarkable. Pancreas: Unremarkable. No pancreatic ductal dilatation or surrounding inflammatory changes. Spleen: Normal in size without focal abnormality. Adrenals/Urinary Tract: Adrenal glands are unremarkable. Kidneys are normal, without renal calculi, focal lesion, or hydronephrosis. Bladder is unremarkable. Stomach/Bowel: The stomach, small bowel, and colon are not abnormally distended. No wall thickening or inflammatory changes are appreciated. The appendix is normal. Vascular/Lymphatic: No significant vascular findings are present. No enlarged abdominal or pelvic lymph nodes. Reproductive: Uterus is anteverted. Small fibroid off of the uterine fundus. No change. No abnormal adnexal masses. Other: No abdominal wall hernia or abnormality. No abdominopelvic ascites. Musculoskeletal: No acute or significant osseous findings. IMPRESSION: 1. No acute process demonstrated  in the abdomen or pelvis. No evidence of bowel obstruction or inflammation. Appendix is normal. 2. Diffuse fatty infiltration of the liver. 3. Lung bases demonstrate nodular airspace infiltrates with air bronchograms compatible with COVID pneumonia. Electronically Signed   By: Burman Nieves M.D.   On: 09/26/2019 06:26    Procedures Procedures (including critical care time)  Medications Ordered in ED Medications  sodium chloride (PF) 0.9 % injection (  Not Given 09/26/19 0505)  acetaminophen (TYLENOL) tablet 650 mg (650 mg Oral Given  09/26/19 0012)  lactated ringers bolus 1,000 mL (1,000 mLs Intravenous New Bag/Given 09/26/19 0023)  sodium chloride 0.9 % bolus 1,000 mL (0 mLs Intravenous Stopped 09/26/19 0433)  ondansetron (ZOFRAN) injection 4 mg (4 mg Intravenous Given 09/26/19 0050)  ketorolac (TORADOL) 30 MG/ML injection 30 mg (30 mg Intravenous Given 09/26/19 0241)  sodium chloride 0.9 % bolus 1,000 mL (0 mLs Intravenous Stopped 09/26/19 0433)  iohexol (OMNIPAQUE) 300 MG/ML solution 100 mL (100 mLs Intravenous Contrast Given 09/26/19 0556)    ED Course  I have reviewed the triage vital signs and the nursing notes.  Pertinent labs & imaging results that were available during my care of the patient were reviewed by me and considered in my medical decision making (see chart for details).    MDM Rules/Calculators/A&P                          Patient was given acetaminophen for fever.  She was given IV fluids.  Laboratory testing was started.  Patient's Covid test is positive.  After her pelvic exam patient had CT abdomen pelvis done.  Susan Fuentes was evaluated in Emergency Department on 09/26/2019 for the symptoms described in the history of present illness. She was evaluated in the context of the global COVID-19 pandemic, which necessitated consideration that the patient might be at risk for infection with the SARS-CoV-2 virus that causes COVID-19. Institutional protocols and algorithms that pertain to the evaluation of patients at risk for COVID-19 are in a state of rapid change based on information released by regulatory bodies including the CDC and federal and state organizations. These policies and algorithms were followed during the patient's care in the ED.   Final Clinical Impression(s) / ED Diagnoses Final diagnoses:  COVID-19 virus infection  Vaginal bleeding  Lower abdominal pain    Rx / DC Orders ED Discharge Orders         Ordered    azithromycin (ZITHROMAX Z-PAK) 250 MG tablet     Discontinue  Reprint      09/26/19 0635    predniSONE (DELTASONE) 20 MG tablet     Discontinue  Reprint     09/26/19 7619        OTC meds  Plan discharge  Devoria Albe, MD, Concha Pyo, MD 09/26/19 [phone]

## 2019-09-26 NOTE — Discharge Instructions (Addendum)
Take the medications as prescribed.  In addition start taking zinc 50 mg once a day, vitamin D and vitamin C.  Take 1 aspirin 81 mg daily, Covid does increase your risk of having blood clots.  Drink plenty of fluids.  Take ibuprofen 600 mg plus acetaminophen 1000 mg 4 times a day for fever or body aches.  Take Mucinex DM if needed to control cough.  20 the day Dr. If you could you should get a pulse oximeter to measure the oxygen in your blood.  You can buy it at the pharmacy and it fits on your finger.  You should return to the emergency department if your oxygen level gets below 90% or if you start struggling to breathe.  You need to quarantine yourself for at least 10 days depending on how your symptoms are doing.  You can have your vaginal bleeding rechecked by a gynecologist in 2 to 3 weeks after you are no longer contagious.

## 2019-09-26 NOTE — ED Notes (Signed)
I-stat beta hCG results : <5.0

## 2019-09-26 NOTE — Telephone Encounter (Signed)
Called to Discuss with patient about Covid symptoms and the use of bamlanivimab, a monoclonal antibody infusion for those with mild to moderate Covid symptoms and at a high risk of hospitalization.     Pt is states that she is not having any symptoms now. Symptoms started 09/18/19, but have cleared at this point.

## 2019-09-28 LAB — GC/CHLAMYDIA PROBE AMP (~~LOC~~) NOT AT ARMC
Chlamydia: NEGATIVE
Comment: NEGATIVE
Comment: NORMAL
Neisseria Gonorrhea: NEGATIVE

## 2019-09-29 ENCOUNTER — Encounter (HOSPITAL_COMMUNITY): Payer: Self-pay

## 2019-09-29 ENCOUNTER — Inpatient Hospital Stay (HOSPITAL_COMMUNITY)
Admission: EM | Admit: 2019-09-29 | Discharge: 2019-10-03 | DRG: 177 | Disposition: A | Discharge status: Home / Self Care | Payer: Medicaid Other | Attending: Student in an Organized Health Care Education/Training Program | Admitting: Student in an Organized Health Care Education/Training Program

## 2019-09-29 ENCOUNTER — Emergency Department (HOSPITAL_COMMUNITY): Payer: Medicaid Other

## 2019-09-29 DIAGNOSIS — D509 Iron deficiency anemia, unspecified: Secondary | ICD-10-CM | POA: Diagnosis not present

## 2019-09-29 DIAGNOSIS — J1282 Pneumonia due to coronavirus disease 2019: Secondary | ICD-10-CM | POA: Diagnosis present

## 2019-09-29 DIAGNOSIS — J9601 Acute respiratory failure with hypoxia: Secondary | ICD-10-CM | POA: Diagnosis present

## 2019-09-29 DIAGNOSIS — Z7951 Long term (current) use of inhaled steroids: Secondary | ICD-10-CM | POA: Diagnosis not present

## 2019-09-29 DIAGNOSIS — R0902 Hypoxemia: Secondary | ICD-10-CM

## 2019-09-29 DIAGNOSIS — J45909 Unspecified asthma, uncomplicated: Secondary | ICD-10-CM | POA: Diagnosis present

## 2019-09-29 DIAGNOSIS — U071 COVID-19: Principal | ICD-10-CM | POA: Diagnosis present

## 2019-09-29 DIAGNOSIS — Z79899 Other long term (current) drug therapy: Secondary | ICD-10-CM | POA: Diagnosis not present

## 2019-09-29 LAB — PROCALCITONIN: Procalcitonin: <0.1 ng/mL

## 2019-09-29 LAB — CBC WITH DIFFERENTIAL/PLATELET
Abs Immature Granulocytes: 0.05 10*3/uL (ref 0.00–0.07)
Basophils Absolute: 0 10*3/uL (ref 0.0–0.1)
Basophils Relative: 0 %
Eosinophils Absolute: 0 10*3/uL (ref 0.0–0.5)
Eosinophils Relative: 0 %
HCT: 39.4 % (ref 36.0–46.0)
Hemoglobin: 12.7 g/dL (ref 12.0–15.0)
Immature Granulocytes: 1 %
Lymphocytes Relative: 29 %
Lymphs Abs: 1.5 10*3/uL (ref 0.7–4.0)
MCH: 27.2 pg (ref 26.0–34.0)
MCHC: 32.2 g/dL (ref 30.0–36.0)
MCV: 84.4 fL (ref 80.0–100.0)
Monocytes Absolute: 0.3 10*3/uL (ref 0.1–1.0)
Monocytes Relative: 5 %
Neutro Abs: 3.4 10*3/uL (ref 1.7–7.7)
Neutrophils Relative %: 65 %
Platelets: 363 10*3/uL (ref 150–400)
RBC: 4.67 MIL/uL (ref 3.87–5.11)
RDW: 13.6 % (ref 11.5–15.5)
WBC: 5.3 10*3/uL (ref 4.0–10.5)
nRBC: 0 % (ref 0.0–0.2)

## 2019-09-29 LAB — COMPREHENSIVE METABOLIC PANEL
ALT: 42 U/L (ref 0–44)
AST: 54 U/L — ABNORMAL HIGH (ref 15–41)
Albumin: 3 g/dL — ABNORMAL LOW (ref 3.5–5.0)
Alkaline Phosphatase: 25 U/L — ABNORMAL LOW (ref 38–126)
Anion gap: 13 (ref 5–15)
BUN: 13 mg/dL (ref 6–20)
CO2: 23 mmol/L (ref 22–32)
Calcium: 8.8 mg/dL — ABNORMAL LOW (ref 8.9–10.3)
Chloride: 103 mmol/L (ref 98–111)
Creatinine, Ser: 0.86 mg/dL (ref 0.44–1.00)
GFR calc Af Amer: >60 mL/min (ref >60)
GFR calc non Af Amer: >60 mL/min (ref >60)
Glucose, Bld: 107 mg/dL — ABNORMAL HIGH (ref 70–99)
Potassium: 4 mmol/L (ref 3.5–5.1)
Sodium: 139 mmol/L (ref 135–145)
Total Bilirubin: 0.7 mg/dL (ref 0.3–1.2)
Total Protein: 7.2 g/dL (ref 6.5–8.1)

## 2019-09-29 LAB — HIV ANTIBODY (ROUTINE TESTING W REFLEX): HIV Screen 4th Generation wRfx: NONREACTIVE

## 2019-09-29 LAB — FIBRINOGEN: Fibrinogen: 672 mg/dL — ABNORMAL HIGH (ref 210–475)

## 2019-09-29 LAB — LACTIC ACID, PLASMA: Lactic Acid, Venous: 1 mmol/L (ref 0.5–1.9)

## 2019-09-29 LAB — TRIGLYCERIDES: Triglycerides: 140 mg/dL (ref <150)

## 2019-09-29 LAB — LACTATE DEHYDROGENASE: LDH: 574 U/L — ABNORMAL HIGH (ref 98–192)

## 2019-09-29 LAB — LIPASE, BLOOD: Lipase: 66 U/L — ABNORMAL HIGH (ref 11–51)

## 2019-09-29 LAB — C-REACTIVE PROTEIN: CRP: 11.4 mg/dL — ABNORMAL HIGH (ref <1.0)

## 2019-09-29 LAB — D-DIMER, QUANTITATIVE: D-Dimer, Quant: 0.97 ug/mL-FEU — ABNORMAL HIGH (ref 0.00–0.50)

## 2019-09-29 LAB — TROPONIN I (HIGH SENSITIVITY): Troponin I (High Sensitivity): 6 ng/L (ref <18)

## 2019-09-29 LAB — I-STAT BETA HCG BLOOD, ED (MC, WL, AP ONLY): I-stat hCG, quantitative: <5 m[IU]/mL (ref <5)

## 2019-09-29 LAB — ABO/RH: ABO/RH(D): A POS

## 2019-09-29 LAB — FERRITIN: Ferritin: 638 ng/mL — ABNORMAL HIGH (ref 11–307)

## 2019-09-29 MED ORDER — SODIUM CHLORIDE 0.9 % IV SOLN
200.0000 mg | Freq: Once | INTRAVENOUS | Status: AC
  Administered 2019-09-29: 200 mg via INTRAVENOUS
  Filled 2019-09-29: qty 40

## 2019-09-29 MED ORDER — RIVAROXABAN 10 MG PO TABS: 10.0000 mg | ORAL_TABLET | Freq: Every day | ORAL | Status: DC

## 2019-09-29 MED ORDER — DEXAMETHASONE 6 MG PO TABS
6.0000 mg | ORAL_TABLET | ORAL | Status: DC
  Administered 2019-09-30 – 2019-10-03 (×4): 6 mg via ORAL
  Filled 2019-09-29: qty 2
  Filled 2019-09-29 (×3): qty 1

## 2019-09-29 MED ORDER — ENOXAPARIN SODIUM 40 MG/0.4ML ~~LOC~~ SOLN
40.0000 mg | SUBCUTANEOUS | Status: DC
  Administered 2019-09-29 – 2019-10-03 (×5): 40 mg via SUBCUTANEOUS
  Filled 2019-09-29 (×5): qty 0.4

## 2019-09-29 MED ORDER — DEXAMETHASONE SODIUM PHOSPHATE 10 MG/ML IJ SOLN
10.0000 mg | Freq: Once | INTRAMUSCULAR | Status: AC
  Administered 2019-09-29: 10 mg via INTRAVENOUS
  Filled 2019-09-29: qty 1

## 2019-09-29 MED ORDER — ONDANSETRON HCL 4 MG/2ML IJ SOLN: 4.0000 mg | Freq: Four times a day (QID) | INTRAMUSCULAR | Status: DC | PRN

## 2019-09-29 MED ORDER — SODIUM CHLORIDE 0.9 % IV SOLN
100.0000 mg | Freq: Every day | INTRAVENOUS | Status: AC
  Administered 2019-09-30 – 2019-10-03 (×4): 100 mg via INTRAVENOUS
  Filled 2019-09-29 (×5): qty 20

## 2019-09-29 MED ORDER — ACETAMINOPHEN 325 MG PO TABS
650.0000 mg | ORAL_TABLET | Freq: Four times a day (QID) | ORAL | Status: DC | PRN
  Administered 2019-09-30 – 2019-10-01 (×2): 650 mg via ORAL
  Filled 2019-09-29 (×2): qty 2

## 2019-09-29 MED ORDER — ACETAMINOPHEN 500 MG PO TABS
1000.0000 mg | ORAL_TABLET | Freq: Once | ORAL | Status: AC
  Administered 2019-09-29: 1000 mg via ORAL
  Filled 2019-09-29: qty 2

## 2019-09-29 MED ORDER — LACTATED RINGERS IV SOLN: INTRAVENOUS | Status: DC

## 2019-09-29 MED ORDER — POLYETHYLENE GLYCOL 3350 17 G PO PACK: 17.0000 g | PACK | Freq: Every day | ORAL | Status: DC | PRN

## 2019-09-29 MED ORDER — LACTATED RINGERS IV SOLN: INTRAVENOUS | Status: AC

## 2019-09-29 MED ORDER — INSULIN ASPART 100 UNIT/ML ~~LOC~~ SOLN
0.0000 [IU] | Freq: Three times a day (TID) | SUBCUTANEOUS | Status: DC
  Administered 2019-10-02: 3 [IU] via SUBCUTANEOUS

## 2019-09-29 MED ORDER — GUAIFENESIN-DM 100-10 MG/5ML PO SYRP
10.0000 mL | ORAL_SOLUTION | ORAL | Status: DC | PRN
  Administered 2019-10-01 – 2019-10-03 (×3): 10 mL via ORAL
  Filled 2019-09-29 (×3): qty 10

## 2019-09-29 MED ORDER — ONDANSETRON HCL 4 MG/2ML IJ SOLN
4.0000 mg | Freq: Once | INTRAMUSCULAR | Status: AC
  Administered 2019-09-29: 4 mg via INTRAVENOUS
  Filled 2019-09-29: qty 2

## 2019-09-29 MED ORDER — ONDANSETRON HCL 4 MG PO TABS: 4.0000 mg | ORAL_TABLET | Freq: Four times a day (QID) | ORAL | Status: DC | PRN

## 2019-09-29 MED ORDER — SODIUM CHLORIDE 0.9 % IV BOLUS
500.0000 mL | Freq: Once | INTRAVENOUS | Status: AC
  Administered 2019-09-29: 500 mL via INTRAVENOUS

## 2019-09-29 NOTE — ED Provider Notes (Signed)
[facility] EMERGENCY DEPARTMENT Provider Note   CSN: [id number] Arrival date & time: 09/29/19  1201     History Chief Complaint  Patient presents with  . Shortness of Breath    Susan Fuentes is a 34 y.o. female.  HPI  Patient is a 34 year old female with a past medical history significant for asthma on Pulmicort and chronic anemia.  Patient was diagnosed with Covid pneumonia 8/5 and discharged she was not hypoxic at that time.  She states that since she has left she has been feeling significantly and progressively worse.  She states she cannot walk more than 5 feet without becoming significantly dyspneic.  She denies any wheezing states that she occasionally coughs which is productive of clear sputum denies any hemoptysis.  No history of DVT or pulmonary embolism.  She states that over the past 3 days that she has had sharp chest pains with deep breathing.  She has no history of hormonal contraceptive use.  No recent surgeries, immobilizations or hemoptysis.  She has occasional dry cough with some's occasional clear sputum production.  No leg swelling or edema.  She states that she has not been vaccinated for Covid.  Denies any history of heart failure, COPD or smoking.   Patient states that she has had no appetite recently.  Has felt nauseous but no vomiting.  Has not been eating for the past few days.  Has been drinking water rarely.  Past Medical History:  Diagnosis Date  . Anemia   . Vitamin D deficiency     There are no problems to display for this patient.   History reviewed. No pertinent surgical history.   OB History   No obstetric history on file.     History reviewed. No pertinent family history.  Social History   Tobacco Use  . Smoking status: Current Some Day Smoker    Types: Cigars  . Smokeless tobacco: Never Used  Vaping Use  . Vaping Use: Every day  Substance Use Topics  . Alcohol use: Yes    Comment: occ  . Drug use: No     Home Medications Prior to Admission medications   Medication Sig Start Date End Date Taking? Authorizing Provider  aspirin-acetaminophen-caffeine (EXCEDRIN MIGRAINE) [id number] MG tablet Take 2 tablets by mouth every 6 (six) hours as needed for headache or migraine.   Yes [provider]  azithromycin (ZITHROMAX Z-PAK) 250 MG tablet Take 2 po the first day then once a day for the next 4 days. Patient taking differently: Take 250-500 mg by mouth See admin instructions. Take 2 tablets (500mg ) by mouth on Day 1, then take 1 tablet (250mg ) by mouth daily on Days 2-5. 09/26/19  Yes , MD  medroxyPROGESTERone (DEPO-PROVERA) 150 MG/ML injection Inject 150 mg into the muscle every 3 (three) months.   Yes [provider]  predniSONE (DELTASONE) 20 MG tablet Take 3 po QD x 3d , then 2 po QD x 3d then 1 po QD x 3d Patient taking differently: Take 20-60 mg by mouth See admin instructions. Take 3 tablets (60mg ) by mouth daily on Day 1-3, 2 tablets (40mg ) by mouth daily on Day 4-6, and 1 tablet (20mg ) by mouth daily on Day 7-9. 09/26/19  Yes Devoria Albe, MD  ondansetron (ZOFRAN ODT) 4 MG disintegrating tablet Take 1 tablet (4 mg total) by mouth every 8 (eight) hours as needed for nausea or vomiting. Patient not taking: Reported on 12/25/2017 11/08/17   Henderly, Britni A, PA-C  Allergies    Promethazine  Review of Systems   Review of Systems  Constitutional: Positive for appetite change, chills, fatigue and fever.  HENT: Negative for congestion.   Eyes: Negative for pain.  Respiratory: Positive for cough and shortness of breath.   Cardiovascular: Positive for chest pain. Negative for leg swelling.  Gastrointestinal: Positive for nausea. Negative for abdominal pain, diarrhea and vomiting.  Genitourinary: Negative for dysuria.  Musculoskeletal: Positive for myalgias.  Skin: Negative for rash.  Neurological: Negative for dizziness and headaches.    Physical Exam Updated  Vital Signs BP 109/81   Pulse (!) 119   Temp (!) 100.4 F (38 C) (Oral)   Resp 13   SpO2 100%   Physical Exam Vitals and nursing note reviewed.  Constitutional:      General: She is in acute distress.     Comments: Fatigued, ill-appearing 34 year old female appears acutely uncomfortable.  Increased work of breathing but speaking in full sentences.  HENT:     Head: Normocephalic and atraumatic.     Nose: Nose normal.  Eyes:     General: No scleral icterus. Cardiovascular:     Rate and Rhythm: Regular rhythm. Tachycardia present.     Pulses: Normal pulses.     Heart sounds: Normal heart sounds.     Comments: Regular rate tachycardia heart rate is 120 on my exam Pulmonary:     Effort: No respiratory distress.     Breath sounds: No wheezing.     Comments: Mildly increased effort of breathing.  Tachypnea with respiratory rate of 24.  Crackles diffusely throughout.  Rare occasional cough. Abdominal:     Palpations: Abdomen is soft.     Tenderness: There is no abdominal tenderness. There is no guarding or rebound.  Musculoskeletal:     Cervical back: Normal range of motion.     Right lower leg: No edema.     Left lower leg: No edema.  Skin:    General: Skin is warm and dry.     Capillary Refill: Capillary refill takes less than 2 seconds.  Neurological:     Mental Status: She is alert. Mental status is at baseline.  Psychiatric:        Mood and Affect: Mood normal.        Behavior: Behavior normal.     ED Results / Procedures / Treatments   Labs (all labs ordered are listed, but only abnormal results are displayed) Labs Reviewed  COMPREHENSIVE METABOLIC PANEL - Abnormal; Notable for the following components:      Result Value   Glucose, Bld 107 (*)    Calcium 8.8 (*)    Albumin 3.0 (*)    AST 54 (*)    Alkaline Phosphatase 25 (*)    All other components within normal limits  LIPASE, BLOOD - Abnormal; Notable for the following components:   Lipase 66 (*)    All  other components within normal limits  D-DIMER, QUANTITATIVE (NOT AT [facility]) - Abnormal; Notable for the following components:   D-Dimer, Quant 0.97 (*)    All other components within normal limits  FERRITIN - Abnormal; Notable for the following components:   Ferritin 638 (*)    All other components within normal limits  FIBRINOGEN - Abnormal; Notable for the following components:   Fibrinogen 672 (*)    All other components within normal limits  C-REACTIVE PROTEIN - Abnormal; Notable for the following components:   CRP 11.4 (*)    All other  components within normal limits  LACTATE DEHYDROGENASE - Abnormal; Notable for the following components:   LDH 574 (*)    All other components within normal limits  CULTURE, BLOOD (ROUTINE X 2)  CULTURE, BLOOD (ROUTINE X 2)  CBC WITH DIFFERENTIAL/PLATELET  LACTIC ACID, PLASMA  TRIGLYCERIDES  LACTIC ACID, PLASMA  PROCALCITONIN  I-STAT BETA HCG BLOOD, ED ([facility], [facility], [facility] ONLY)  TROPONIN I (HIGH SENSITIVITY)    EKG EKG Interpretation  Date/Time:  Monday September 29 2019 12:15:00 EDT Ventricular Rate:  136 PR Interval:    QRS Duration: 73 QT Interval:  293 QTC Calculation: 441 R Axis:   73 Text Interpretation: Sinus tachycardia Nonspecific T abnormalities, lateral leads similar to Sep 25 2019 Confirmed by Pricilla Loveless [phone]) on 09/29/2019 12:30:52 PM   Radiology DG Chest Portable 1 View  Result Date: 09/29/2019 CLINICAL DATA:  Shortness of breath, chest pain. EXAM: PORTABLE CHEST 1 VIEW COMPARISON:  None. FINDINGS: Hypoinflated lungs. Bibasilar and mid lung predominant patchy and confluent opacities. No pneumothorax. Trace left pleural effusion. Cardiomediastinal silhouette within normal limits. No acute osseous abnormality. IMPRESSION: Multifocal opacities concerning for pneumonia. Trace left pleural effusion. Electronically Signed   By: Stana Bunting M.D.   On: 09/29/2019 14:01    Procedures .Critical Care Performed by: Gailen Shelter, PA Authorized by: Gailen Shelter, PA   Critical care provider statement:    Critical care time (minutes):  35   Critical care time was exclusive of:  Separately billable procedures and treating other patients and teaching time   Critical care was necessary to treat or prevent imminent or life-threatening deterioration of the following conditions:  Respiratory failure   Critical care was time spent personally by me on the following activities:  Discussions with consultants, evaluation of patient's response to treatment, examination of patient, review of old charts, re-evaluation of patient's condition, pulse oximetry, ordering and review of radiographic studies, ordering and review of laboratory studies and ordering and performing treatments and interventions   I assumed direction of critical care for this patient from another provider in my specialty: no   Comments:     Hypoxia secondary to COVID-19   (including critical care time)  Medications Ordered in ED Medications  acetaminophen (TYLENOL) tablet 1,000 mg (1,000 mg Oral Given 09/29/19 1332)  sodium chloride 0.9 % bolus 500 mL (0 mLs Intravenous Stopped 09/29/19 1614)  ondansetron (ZOFRAN) injection 4 mg (4 mg Intravenous Given 09/29/19 1449)  dexamethasone (DECADRON) injection 10 mg (10 mg Intravenous Given 09/29/19 1614)    ED Course  I have reviewed the triage vital signs and the nursing notes.  Pertinent labs & imaging results that were available during my care of the patient were reviewed by me and considered in my medical decision making (see chart for details).  Patient is known Covid positive.  She has been having worsening symptoms over the past 5 days.  Physical exam notable for crackles diffusely.  No wheezing.  Doubt asthma exacerbation.  Suspect that this is isolated Covid.  Pulmonary embolism unlikely patient has no risk factors other than Covid infection itself which explains her symptoms.  She is mildly febrile.  Was  given Tylenol, small aliquot of 500 mL of fluid, Zofran and Decadron.  Admission labs ordered.  Patient desatted to 87 on standing up without oxygen.  She is stable on 4 L.  CBC without leukocytosis or anemia.    I-STAT hCG is negative.  Blood cultures obtained and pending.  CMP with  no pertinent abnormalities.  Mildly low albumin very mildly elevated AST.  Acute phase reactants elevated consistent with Covid.  Dimer, fibrinogen, ferritin, CRP and LDH elevated.  Lipase is very mildly elevated at 66.pancreatitis.  She has no abdominal tenderness or epigastric pain at this time.  She does appear somewhat dehydrated she will likely benefit from small aliquot of fluid.  Chest x-ray reviewed myself shows multifocal pneumonia consistent with Covid.  Very small trace left pleural effusion.  Clinical Course as of Sep 29 1619  Mon Sep 29, 2019  1519 Given positive patient with shortness of breath.  Chest x-ray shows multifocal opacities likely viral pneumonia likely Covid.  She is very tachycardic will provide with some fluid.  Zofran and Tylenol given as well.   IMPRESSION: Multifocal opacities concerning for pneumonia. Trace left pleural effusion.   [WF]  1613 Discussed with Josh of internal medicine teaching service who will admit patient for further evaluation and treatment.  Patient appears stable at 4 L of oxygen at this time.  Discussed with hospitalist low suspicion for pulmonary embolism.  They will assess patient and continued care at this time.  Patient made aware of recommendation for admission.  She is agreeable to plan.   [WF]    Clinical Course User Index [WF] Gailen Shelter, [place]   Susan Fuentes was evaluated in Emergency Department on 09/29/2019 for the symptoms described in the history of present illness. She was evaluated in the context of the global COVID-19 pandemic, which necessitated consideration that the patient might be at risk for infection with the SARS-CoV-2 virus that  causes COVID-19. Institutional protocols and algorithms that pertain to the evaluation of patients at risk for COVID-19 are in a state of rapid change based on information released by regulatory bodies including the CDC and federal and state organizations. These policies and algorithms were followed during the patient's care in the ED.   MDM Rules/Calculators/A&P                          Final Clinical Impression(s) / ED Diagnoses Final diagnoses:  Pneumonia due to COVID-19 virus  Hypoxia  COVID-19    Rx / DC Orders ED Discharge Orders    None       Gailen Shelter, [place] 09/29/19 1621    Pricilla Loveless, MD 09/30/19 [phone]

## 2019-09-29 NOTE — ED Notes (Signed)
Pt did not eat dinner, stated she has not eaten in a while and she was concerned that spaghetti would not do well in her stomach.  RN offered and obtained chicken broth, apple sauce and water per her request.  RN also ordered a hospital bed for the pt.

## 2019-09-29 NOTE — ED Notes (Signed)
Dinner ordered 

## 2019-09-29 NOTE — H&P (Addendum)
Date: 09/29/2019               Patient Name:  Susan Fuentes MRN: [id number]  DOB: Jul 03, 1985 Age / Sex: 34 y.o., female   PCP: Verlon Au, MD         Medical Service: Internal Medicine Teaching Service         Attending Physician: Dr. Oswaldo Done, Marquita Palms, *    First Contact: Dr. Judeth Cornfield Pager: [phone]            After Hours (After 5p/  First Contact Pager: [phone]  weekends / holidays): Second Contact Pager: [phone]   Chief Complaint: Fever  History of Present Illness:  Susan Fuentes is a 34 yo F w/ PMH of iron deficiency anemia presenting to [facility] w/ shortness of breath, fever and malaise. She was in her usual state of health until 09/22/19 when she had lunch with an associate who she suspects may have had COVID. Shortly after she began to have malaise, subjective chills and abdominal pain. She presented to the ED on 09/25/19 and was found to be COVID positive but had no oxygen requirement at the time and was discharged with azithromycin and prednisone. However, her symptoms continued to worsen despite taking the medications as prescribed and she presented to the ED for further evaluation after developing worsening dyspnea, light-headedness, fevers, malaise, diarrhea, nausea and palpitations. She mentions having poor appetite due to her sick ness and have not been able to tolerate oral intake for two days.  She mentions that she had scheduled an appointment for her and her kids later this month to get the COVID vaccine but has not yet received the vaccine.  Meds:  Takes no chronic meds at home  Allergies: Allergies as of 09/29/2019 - Review Complete 09/29/2019  Allergen Reaction Noted   Promethazine Hives 06/29/2014   Past Medical History:  Diagnosis Date   Anemia    Vitamin D deficiency    Family History: Father passed 5 months ago from lung cancer  Social History: Lives with 3 kids (all teenagers) who are currently on summer trip.   Review of Systems: A  complete ROS was negative except as per HPI.  Physical Exam: Blood pressure 109/81, pulse (!) 119, temperature (!) 100.4 F (38 C), temperature source Oral, resp. rate 13, SpO2 100 %.  Gen: ill-appearing HEENT: EOMI, [place] in place @ 4L, Dry mucous membranes CV: Tachycardic, regular rhythm, no murmurs, no gallops Pulm: Tachypneic, bilateral crackles, accessory muscle use Abd: Soft, BS+, NTND, No rebound, no guarding Extm: ROM intact, Peripheral pulses intact, No peripheral edema Skin: Diaphoretic, Warm, poor turgor Neuro: AAOx3  EKG: personally reviewed my interpretation is sinus tachycardia, no ischemic changes  CXR: personally reviewed my interpretation is bilateral multifocal opacities.  Assessment & Plan by Problem:  Active Problems:   Pneumonia due to COVID-19 virus  Susan Fuentes is a 34 yo F w/ PMH of iron deficiency anemia admit for acute hypoxic respiratory failure 2/2 COVID pneumonia  Acute respiratory failure 2/2 COVID pneumonia Present w/ acute hypoxic respiratory failure, currently requiring 4L oxygen. Inflammatory markers elevated at CRP 11.4, D-dimer 0.97. + sick contact. Unvaccinated status. COVID +. Worsening symptoms despite oral therapy. Chest X-ray w/ multifocal opacities. Airspace infiltrates seen on CT abd/pelvis 09/26/19. Need admission for treatment for COVID pneumonia - Start remdesivir (day 1/5) - Start dexamethasone (day 1/10) - Trend CMP, inflammatory markers - O2 as need to keep >88 - Early ambulation, proning as tolerated, incentive spirometry -  SSI while on steroids  DVT prophx: Lovenox Diet: CM Bowel: Miralax Code: Full  Prior to Admission Living Arrangement: Home Anticipated Discharge Location: Home Barriers to Discharge: Medical treatment  Dispo: Admit patient to Inpatient with expected length of stay greater than 2 midnights.  Signed: [name], MD 09/29/2019, 5:47 PM Pager: [phone] After 5pm on weekdays and 1pm on weekends: On Call  Pager: [phone]

## 2019-09-29 NOTE — ED Notes (Signed)
Dinner delivered.

## 2019-09-29 NOTE — ED Triage Notes (Addendum)
Per EMS pt test positive for covid on Friday and states she feels SOB, weak, fatigue, and has a cough and fever for a couple of weeks now. EMS reports 88% on room air and 99% on 4 L

## 2019-09-29 NOTE — ED Notes (Signed)
Pt is a hard IV stick. Attempted 2x. MD notified.

## 2019-09-29 NOTE — ED Notes (Signed)
Pt placed on hospital bed for comfort.  Pt was able to stand and pivot to the chair and back to the bed.  She did become SOB but quickly recovered.

## 2019-09-30 ENCOUNTER — Inpatient Hospital Stay (HOSPITAL_COMMUNITY): Payer: Medicaid Other

## 2019-09-30 DIAGNOSIS — J1282 Pneumonia due to coronavirus disease 2019: Secondary | ICD-10-CM

## 2019-09-30 DIAGNOSIS — J9601 Acute respiratory failure with hypoxia: Secondary | ICD-10-CM

## 2019-09-30 DIAGNOSIS — D509 Iron deficiency anemia, unspecified: Secondary | ICD-10-CM

## 2019-09-30 LAB — CBG MONITORING, ED
Glucose-Capillary: 113 mg/dL — ABNORMAL HIGH (ref 70–99)
Glucose-Capillary: 119 mg/dL — ABNORMAL HIGH (ref 70–99)
Glucose-Capillary: 125 mg/dL — ABNORMAL HIGH (ref 70–99)
Glucose-Capillary: 137 mg/dL — ABNORMAL HIGH (ref 70–99)

## 2019-09-30 LAB — D-DIMER, QUANTITATIVE: D-Dimer, Quant: 0.94 ug/mL-FEU — ABNORMAL HIGH (ref 0.00–0.50)

## 2019-09-30 LAB — COMPREHENSIVE METABOLIC PANEL
ALT: 34 U/L (ref 0–44)
AST: 36 U/L (ref 15–41)
Albumin: 2.6 g/dL — ABNORMAL LOW (ref 3.5–5.0)
Alkaline Phosphatase: 24 U/L — ABNORMAL LOW (ref 38–126)
Anion gap: 11 (ref 5–15)
BUN: 14 mg/dL (ref 6–20)
CO2: 25 mmol/L (ref 22–32)
Calcium: 8.6 mg/dL — ABNORMAL LOW (ref 8.9–10.3)
Chloride: 104 mmol/L (ref 98–111)
Creatinine, Ser: 0.73 mg/dL (ref 0.44–1.00)
GFR calc Af Amer: >60 mL/min (ref >60)
GFR calc non Af Amer: >60 mL/min (ref >60)
Glucose, Bld: 132 mg/dL — ABNORMAL HIGH (ref 70–99)
Potassium: 4.5 mmol/L (ref 3.5–5.1)
Sodium: 140 mmol/L (ref 135–145)
Total Bilirubin: 0.4 mg/dL (ref 0.3–1.2)
Total Protein: 6.8 g/dL (ref 6.5–8.1)

## 2019-09-30 LAB — CBC WITH DIFFERENTIAL/PLATELET
Abs Immature Granulocytes: 0 10*3/uL (ref 0.00–0.07)
Basophils Absolute: 0 10*3/uL (ref 0.0–0.1)
Basophils Relative: 0 %
Eosinophils Absolute: 0 10*3/uL (ref 0.0–0.5)
Eosinophils Relative: 0 %
HCT: 36.7 % (ref 36.0–46.0)
Hemoglobin: 11.6 g/dL — ABNORMAL LOW (ref 12.0–15.0)
Lymphocytes Relative: 36 %
Lymphs Abs: 1.3 10*3/uL (ref 0.7–4.0)
MCH: 27.2 pg (ref 26.0–34.0)
MCHC: 31.6 g/dL (ref 30.0–36.0)
MCV: 86.2 fL (ref 80.0–100.0)
Monocytes Absolute: 0.1 10*3/uL (ref 0.1–1.0)
Monocytes Relative: 4 %
Neutro Abs: 2.1 10*3/uL (ref 1.7–7.7)
Neutrophils Relative %: 60 %
Platelets: 380 10*3/uL (ref 150–400)
RBC: 4.26 MIL/uL (ref 3.87–5.11)
RDW: 13.8 % (ref 11.5–15.5)
WBC: 3.5 10*3/uL — ABNORMAL LOW (ref 4.0–10.5)
nRBC: 0 % (ref 0.0–0.2)
nRBC: 0 /100 WBC

## 2019-09-30 LAB — C-REACTIVE PROTEIN: CRP: 9.5 mg/dL — ABNORMAL HIGH (ref <1.0)

## 2019-09-30 MED ORDER — IOHEXOL 350 MG/ML SOLN
100.0000 mL | Freq: Once | INTRAVENOUS | Status: AC | PRN
  Administered 2019-09-30: 100 mL via INTRAVENOUS

## 2019-09-30 MED ORDER — HYDROCOD POLST-CPM POLST ER 10-8 MG/5ML PO SUER
5.0000 mL | Freq: Two times a day (BID) | ORAL | Status: DC | PRN
  Administered 2019-09-30 – 2019-10-01 (×2): 5 mL via ORAL
  Filled 2019-09-30 (×2): qty 5

## 2019-09-30 NOTE — ED Notes (Signed)
Lunch Tray Ordered @ 1048. °

## 2019-09-30 NOTE — Progress Notes (Signed)
   Subjective:  Susan Fuentes is a 34 y.o. w/ no significant PMH admit for COVID pneumonia on hospital day 1  Susan Fuentes was examined and evaluated at bedside this am. She mentions that this morning she developed acute onset chest pain. Describes the pain as constant soreness that feel like muscle cramp. Exacerbated by deep breaths.   Objective:  Vital signs in last 24 hours: Vitals:   09/30/19 0813 09/30/19 0900 09/30/19 1000 09/30/19 1200  BP: 109/79 114/84 (!) 117/92 114/83  Pulse: (!) 117 76 79 79  Resp: (!) 27 (!) 26 (!) 21 (!) 33  Temp: 98.4 F (36.9 C)     TempSrc: Oral     SpO2: 98% 93% 100% 97%   Gen: ill-appearing CV: RRR, S1, S2 normal, No rubs, no chest wall tenderness Pulm: Bibasilar rales, no wheezes Abd: Soft, BS+, NTND, No rebound, no guarding Extm: ROM intact, Peripheral pulses intact, No peripheral edema Skin: Dry, Warm  Assessment/Plan:  Active Problems:   Pneumonia due to COVID-19 virus   Susan Fuentes is a 34 yo F w/ PMH of iron deficiency anemia admit for acute hypoxic respiratory failure 2/2 COVID pneumonia  Atypical Chest Pain 2/2 PE vs [facility] Presenting w/ acute onset chest pain this am. COVID-19 puts her at risk for provoked PE especially considering elevated D-dimer at 0.94. Could be due to muscular strain from frequent coughs - Stat CTA - C/w cough suppressants - Monitor for worsening hypoxia, hemoptysis or chest pain  Acute respiratory failure 2/2 COVID pneumonia Continuing to have hypoxic resp failure requiring 4Ls.  Inflammatory markers downtrending CRP 11.4->9.5, D-dimer 0.97->0.94. On treatment day 2. Unable to wean off oxygen at the moment. LFTs wnl. - Start remdesivir (day 2/5) - Start dexamethasone (day 2/10) - Trend CMP, inflammatory markers - O2 as need to keep >88 - Early ambulation, proning as tolerated, incentive spirometry - SSI while on steroids  DVT prophx: lovenox Diet: CM Bowel: Miralax  Code: Full  Prior to Admission Living  Arrangement: Home Anticipated Discharge Location: Home Barriers to Discharge: Medical treatment Dispo: Anticipated discharge in approximately 3-4 day(s).   [name], MD 09/30/2019, 1:11 PM Pager: [phone] After 5pm on weekdays and 1pm on weekends: On Call Pager: [phone]

## 2019-09-30 NOTE — ED Notes (Addendum)
Resting comfortably.

## 2019-10-01 ENCOUNTER — Other Ambulatory Visit: Payer: Self-pay

## 2019-10-01 LAB — CBC
HCT: 36.5 % (ref 36.0–46.0)
Hemoglobin: 11.8 g/dL — ABNORMAL LOW (ref 12.0–15.0)
MCH: 27.4 pg (ref 26.0–34.0)
MCHC: 32.3 g/dL (ref 30.0–36.0)
MCV: 84.7 fL (ref 80.0–100.0)
Platelets: 478 10*3/uL — ABNORMAL HIGH (ref 150–400)
RBC: 4.31 MIL/uL (ref 3.87–5.11)
RDW: 13.4 % (ref 11.5–15.5)
WBC: 7.1 10*3/uL (ref 4.0–10.5)
nRBC: 0 % (ref 0.0–0.2)

## 2019-10-01 LAB — COMPREHENSIVE METABOLIC PANEL
ALT: 32 U/L (ref 0–44)
AST: 27 U/L (ref 15–41)
Albumin: 2.7 g/dL — ABNORMAL LOW (ref 3.5–5.0)
Alkaline Phosphatase: 26 U/L — ABNORMAL LOW (ref 38–126)
Anion gap: 9 (ref 5–15)
BUN: 15 mg/dL (ref 6–20)
CO2: 25 mmol/L (ref 22–32)
Calcium: 8.6 mg/dL — ABNORMAL LOW (ref 8.9–10.3)
Chloride: 103 mmol/L (ref 98–111)
Creatinine, Ser: 0.71 mg/dL (ref 0.44–1.00)
GFR calc Af Amer: >60 mL/min (ref >60)
GFR calc non Af Amer: >60 mL/min (ref >60)
Glucose, Bld: 139 mg/dL — ABNORMAL HIGH (ref 70–99)
Potassium: 4.4 mmol/L (ref 3.5–5.1)
Sodium: 137 mmol/L (ref 135–145)
Total Bilirubin: 0.5 mg/dL (ref 0.3–1.2)
Total Protein: 6.6 g/dL (ref 6.5–8.1)

## 2019-10-01 LAB — GLUCOSE, CAPILLARY
Glucose-Capillary: 119 mg/dL — ABNORMAL HIGH (ref 70–99)
Glucose-Capillary: 108 mg/dL — ABNORMAL HIGH (ref 70–99)
Glucose-Capillary: 127 mg/dL — ABNORMAL HIGH (ref 70–99)
Glucose-Capillary: 146 mg/dL — ABNORMAL HIGH (ref 70–99)

## 2019-10-01 LAB — C-REACTIVE PROTEIN: CRP: 4.9 mg/dL — ABNORMAL HIGH (ref <1.0)

## 2019-10-01 LAB — PROCALCITONIN: Procalcitonin: <0.1 ng/mL

## 2019-10-01 LAB — D-DIMER, QUANTITATIVE: D-Dimer, Quant: 0.6 ug/mL-FEU — ABNORMAL HIGH (ref 0.00–0.50)

## 2019-10-01 NOTE — ED Notes (Signed)
CBG not crossing over, glucometer reading 108

## 2019-10-01 NOTE — Progress Notes (Signed)
   S: Still feeling short of breath and fatigued today.  Moderate cough throughout the night.  Not able to get out of bed due to shortness of breath.  Chest pain improved.  O:  Vitals:   10/01/19 0326 10/01/19 0725  BP: 111/88 125/87  Pulse: 68 87  Resp: 18 (!) 22  Temp: 98 F (36.7 C) 97.8 F (36.6 C)  SpO2: 100% 97%   Gen: Tired and ill-appearing woman lying in bed Lungs: Tachypneic and mildly labored breathing Ext: Warm and well-perfused with no lower extremity edema Psych: Anxious appearing  A/P: 34 year old otherwise healthy person admitted with acute hypoxic respiratory failure due to pneumonia and COVID-19.  Active Problems:   Pneumonia due to COVID-19 virus  Pneumonia due to COVID-19 virus: Heavy symptom burden from Covid 19 but stable compared to yesterday.  Requiring 4 L supplemental oxygen to maintain her saturations.  Chest CT yesterday ruled out pulmonary embolism as the cause of her chest pain.  Likely it is related to chest wall strain from coughing.  CT also shows progression of her infiltrates.  Procalcitonin 2 days ago was negative, will repeat it today, overall I have low suspicion for superimposed bacterial pneumonia.  Plan to continue treatment with remdesivir and dexamethasone, today is day 3 of 5-day treatment.  Liver function testing normal today.  Inflammatory markers are downtrending nicely.  We talked about out of bed activity and self proning throughout the day.  We encouraged incentive spirometer and Acapella valve.  DVT PPx: Lovenox 40 mg daily  FEN: Regular diet  Dispo: I anticipate she will need the full 5-day treatment in hospital given her symptom burden.   Susan Alias, MD 10/01/2019, 11:03 AM

## 2019-10-02 LAB — CBC
HCT: 37.1 % (ref 36.0–46.0)
Hemoglobin: 11.8 g/dL — ABNORMAL LOW (ref 12.0–15.0)
MCH: 27.1 pg (ref 26.0–34.0)
MCHC: 31.8 g/dL (ref 30.0–36.0)
MCV: 85.3 fL (ref 80.0–100.0)
Platelets: 540 10*3/uL — ABNORMAL HIGH (ref 150–400)
RBC: 4.35 MIL/uL (ref 3.87–5.11)
RDW: 13.2 % (ref 11.5–15.5)
WBC: 8.6 10*3/uL (ref 4.0–10.5)
nRBC: 0 % (ref 0.0–0.2)

## 2019-10-02 LAB — COMPREHENSIVE METABOLIC PANEL
ALT: 29 U/L (ref 0–44)
AST: 25 U/L (ref 15–41)
Albumin: 2.7 g/dL — ABNORMAL LOW (ref 3.5–5.0)
Alkaline Phosphatase: 21 U/L — ABNORMAL LOW (ref 38–126)
Anion gap: 9 (ref 5–15)
BUN: 19 mg/dL (ref 6–20)
CO2: 27 mmol/L (ref 22–32)
Calcium: 8.3 mg/dL — ABNORMAL LOW (ref 8.9–10.3)
Chloride: 101 mmol/L (ref 98–111)
Creatinine, Ser: 0.72 mg/dL (ref 0.44–1.00)
GFR calc Af Amer: >60 mL/min (ref >60)
GFR calc non Af Amer: >60 mL/min (ref >60)
Glucose, Bld: 113 mg/dL — ABNORMAL HIGH (ref 70–99)
Potassium: 3.9 mmol/L (ref 3.5–5.1)
Sodium: 137 mmol/L (ref 135–145)
Total Bilirubin: 0.6 mg/dL (ref 0.3–1.2)
Total Protein: 6.3 g/dL — ABNORMAL LOW (ref 6.5–8.1)

## 2019-10-02 LAB — GLUCOSE, CAPILLARY
Glucose-Capillary: 102 mg/dL — ABNORMAL HIGH (ref 70–99)
Glucose-Capillary: 110 mg/dL — ABNORMAL HIGH (ref 70–99)
Glucose-Capillary: 184 mg/dL — ABNORMAL HIGH (ref 70–99)
Glucose-Capillary: 110 mg/dL — ABNORMAL HIGH (ref 70–99)

## 2019-10-02 LAB — C-REACTIVE PROTEIN: CRP: 1.5 mg/dL — ABNORMAL HIGH (ref <1.0)

## 2019-10-02 LAB — D-DIMER, QUANTITATIVE: D-Dimer, Quant: 0.52 ug/mL-FEU — ABNORMAL HIGH (ref 0.00–0.50)

## 2019-10-02 NOTE — Progress Notes (Signed)
    S: Feeling short of breath and very fatigued today.  Feeling weak, difficulty getting out of bed.  Feels very short of breath with ambulating to the sink.  Not much cough.  Still having some chest pain especially at night.  O:  Vitals:   10/02/19 0424 10/02/19 1343  BP: 91/66 101/77  Pulse: 89 71  Resp: 18 (!) 24  Temp: 98.5 F (36.9 C) 97.9 F (36.6 C)  SpO2: 97% 100%   Gen: Tired appearing woman, no distress Lungs: Unlabored, a little tachypneic, coarse breath sounds bilaterally, requiring 4 L nasal cannula Ext: Warm and well-perfused with no lower extremity edema Psych: Depressed appearing affect today  A/P: 34 year old person who is otherwise healthy admitted with acute hypoxic respiratory failure due to COVID-19 pneumonia  Active Problems:   Pneumonia due to COVID-19 virus  COVID-19 pneumonia: Continues to have significant symptoms including dyspnea on exertion and fatigue.  Requiring 4 L supplemental oxygen at rest, worse when she ambulates.  Today is day 4 of remdesivir and dexamethasone, tolerating these well.  Talked about using incentive spirometer and Acapella valve today.  Also talked about self proning as able.  Needs more out of bed activity, time in the chair.  May potentially be ready for discharge tomorrow, but will need to check ambulatory oxygen saturations.  DVT PPx: Lovenox  Regular diet   Tyson Alias, MD 10/02/2019, 3:22 PM

## 2019-10-03 LAB — CBC
HCT: 38.3 % (ref 36.0–46.0)
Hemoglobin: 12.3 g/dL (ref 12.0–15.0)
MCH: 27.2 pg (ref 26.0–34.0)
MCHC: 32.1 g/dL (ref 30.0–36.0)
MCV: 84.7 fL (ref 80.0–100.0)
Platelets: 560 K/uL — ABNORMAL HIGH (ref 150–400)
RBC: 4.52 MIL/uL (ref 3.87–5.11)
RDW: 13 % (ref 11.5–15.5)
WBC: 8 K/uL (ref 4.0–10.5)
nRBC: 0 % (ref 0.0–0.2)

## 2019-10-03 LAB — COMPREHENSIVE METABOLIC PANEL
ALT: 42 U/L (ref 0–44)
AST: 37 U/L (ref 15–41)
Albumin: 2.7 g/dL — ABNORMAL LOW (ref 3.5–5.0)
Alkaline Phosphatase: 25 U/L — ABNORMAL LOW (ref 38–126)
Anion gap: 10 (ref 5–15)
BUN: 19 mg/dL (ref 6–20)
CO2: 25 mmol/L (ref 22–32)
Calcium: 8.6 mg/dL — ABNORMAL LOW (ref 8.9–10.3)
Chloride: 102 mmol/L (ref 98–111)
Creatinine, Ser: 0.68 mg/dL (ref 0.44–1.00)
GFR calc Af Amer: >60 mL/min (ref >60)
GFR calc non Af Amer: >60 mL/min (ref >60)
Glucose, Bld: 103 mg/dL — ABNORMAL HIGH (ref 70–99)
Potassium: 4.4 mmol/L (ref 3.5–5.1)
Sodium: 137 mmol/L (ref 135–145)
Total Bilirubin: 0.5 mg/dL (ref 0.3–1.2)
Total Protein: 6.3 g/dL — ABNORMAL LOW (ref 6.5–8.1)

## 2019-10-03 LAB — D-DIMER, QUANTITATIVE: D-Dimer, Quant: 0.52 ug/mL-FEU — ABNORMAL HIGH (ref 0.00–0.50)

## 2019-10-03 LAB — GLUCOSE, CAPILLARY
Glucose-Capillary: 89 mg/dL (ref 70–99)
Glucose-Capillary: 109 mg/dL — ABNORMAL HIGH (ref 70–99)
Glucose-Capillary: 108 mg/dL — ABNORMAL HIGH (ref 70–99)

## 2019-10-03 LAB — C-REACTIVE PROTEIN: CRP: 1.2 mg/dL — ABNORMAL HIGH

## 2019-10-03 MED ORDER — DEXAMETHASONE 6 MG PO TABS: 6.0000 mg | ORAL_TABLET | ORAL | 0 refills | Status: AC

## 2019-10-03 MED ORDER — GUAIFENESIN-DM 100-10 MG/5ML PO SYRP: 10.0000 mL | ORAL_SOLUTION | ORAL | 0 refills | Status: AC | PRN

## 2019-10-03 MED FILL — SM TUSSIN DM SYRUP: 100-10 | 5 days supply | Qty: 236 | Fill #0

## 2019-10-03 MED FILL — DEXAMETHASONE 6 MG TABLET: 6 | 5 days supply | Qty: 5 | Fill #0

## 2019-10-03 NOTE — Progress Notes (Signed)
Oxygen delivered to patient from DME company. Instructions demonstrated and verbalized to patient. Pt verbalizes full understanding of its use and denies any questions or concerns.

## 2019-10-03 NOTE — Progress Notes (Signed)
Patient tolerated walk fairly but was short of breath. She had an elevated heart rate(up to 145 on the [facility]) and O2 stats in the 80s.

## 2019-10-03 NOTE — Progress Notes (Addendum)
Pt did not tolerate being up without oxygen. While on RA pt desated to 84% and her HR increased to 140's.

## 2019-10-03 NOTE — Progress Notes (Addendum)
   Subjective:  Susan Fuentes is a 34 y.o. w/ no significant PMH admit for COVID pneumonia on hospital day 4  Susan Fuentes was examined and evaluated at bedside this am. She mentions that she continues to feel weak. Discussed following up with post-covid symptom management clinic. She states she feels she needs nebulizer or oxygen machine. Discussed need to f/u with ambulatory pulse ox.   Objective:  Vital signs in last 24 hours: Vitals:   10/02/19 1720 10/02/19 2033 10/03/19 0514 10/03/19 0612  BP:  114/77 92/72 106/76  Pulse: 75 87 87 79  Resp: (!) 25 13 20  (!) 25  Temp:  98.9 F (37.2 C) 98.4 F (36.9 C)   TempSrc:  Oral Oral   SpO2: 99% 99% 99%   Weight:      Height:       Gen: Well-developed, well nourished, NAD HEENT: NCAT head, hearing intact, EOMI, MMM Pulm: Breathing comfortably on room air  Extm: ROM intact, No peripheral edema Skin: Dry, Warm, normal turgor Psych: Anxious  Assessment/Plan:  Active Problems:   Pneumonia due to COVID-19 virus   Susan Fuentes is a 34 yo F w/ PMH of iron deficiency anemia admit for acute hypoxic respiratory failure 2/2 COVID pneumonia  Acute respiratory failure 2/2 COVID pneumonia Symptoms improving. Currently 99 on room air at rest.  Inflammatory markers downtrending CRP 11.4->9.5->1.2, D-dimer 0.97->0.94->0.52. Completed 5 day course of remdesivir and oxygen requirements have improved discharge home today. - Finish last dose of remdesivir (day 5/5) - C/w dexamethasone (day 5/10) - Ambulatory pulse ox and discharge home +/- oxygen - Trend CMP, inflammatory markers - O2 as need to keep >88 - Early ambulation, proning as tolerated, incentive spirometry - SSI while on steroids  DVT prophx: lovenox Diet: CM Bowel: Miralax  Code: Full  Prior to Admission Living Arrangement: Home Anticipated Discharge Location: Home Barriers to Discharge: Medical treatment Dispo: Anticipated discharge in approximately today(s).   31,  MD 10/03/2019, 8:36 AM Pager: [phone] After 5pm on weekdays and 1pm on weekends: On Call Pager: [phone]

## 2019-10-03 NOTE — Discharge Summary (Addendum)
Name: Susan Fuentes MRN: [id number] DOB: September 10, 1985 34 y.o. PCP: Verlon Au, MD  Date of Admission: 09/29/2019 12:01 PM Date of Discharge: 10/03/2019 Attending Physician: Tyson Alias, *  Discharge Diagnosis: 1. COVID pneumonia  Discharge Medications: Allergies as of 10/03/2019      Reactions   Promethazine Hives   Rash      Medication List    STOP taking these medications   aspirin-acetaminophen-caffeine 250-250-65 MG tablet Commonly known as: EXCEDRIN MIGRAINE   azithromycin 250 MG tablet Commonly known as: Zithromax Z-Pak   medroxyPROGESTERone 150 MG/ML injection Commonly known as: DEPO-PROVERA   ondansetron 4 MG disintegrating tablet Commonly known as: Zofran ODT   predniSONE 20 MG tablet Commonly known as: DELTASONE     TAKE these medications   dexamethasone 6 MG tablet Commonly known as: DECADRON Take 1 tablet (6 mg total) by mouth daily for 5 days. Start taking on: October 04, 2019   guaiFENesin-dextromethorphan 100-10 MG/5ML syrup Commonly known as: ROBITUSSIN DM Take 10 mLs by mouth every 4 (four) hours as needed for cough.            Durable Medical Equipment  (From admission, onward)         Start     Ordered   10/03/19 1528  DME Oxygen  Once       Question Answer Comment  Length of Need 6 Months   Mode or (Route) Nasal cannula   Liters per Minute 2   Frequency Continuous (stationary and portable oxygen unit needed)   Oxygen delivery system Gas      10/03/19 1528          Disposition and follow-up:   Susan Fuentes was discharged from [facility] in [place] condition.  At the hospital follow up visit please address:  1.  COVID pneumonia - Ensure she completes her course of dexamethasone - Consider referral to post-COVID symptom management center at [facility] near [place] [phone].)  2.  Labs / imaging needed at time of follow-up: N/A  3.  Pending labs/ test needing follow-up:  N/A  Follow-up Appointments:  Follow-up Information    Verlon Au, MD. Call.   Specialty: Family Medicine Contact information: [address] [place] [address] [phone]        [facility]. Call.   Contact information: [address] [place] [address] [phone]              Hospital Course by problem list:  1. Acute respiratory failure 2/2 COVID pneumonia: Susan Fuentes is unvaccinated individual w/o significant PMH presenting to [facility] w/ shortness of breath. Found to have acute hypoxic respiratory failure with elevated inflammatory markers and multi-focal opacities on X-ray. CTA chest was also performed to r/o PE which was negative. COVID test came back positive. Treated w/ 5 day course of remdesivir and dexamethasone. Oxygen requirement improved down to room air at rest on hospital day 5 although continuing to be hypoxic with ambulation. Discharged with home oxygen and recommendation to complete 5 additional days of dexamethasone.  Discharge Vitals:   BP 116/80 (BP Location: Left Arm)   Pulse 94   Temp 98.2 F (36.8 C) (Oral)   Resp (!) 25   Ht 5' (1.524 m)   Wt 78.6 kg   SpO2 94%   BMI 33.84 kg/m   Pertinent Labs, Studies, and Procedures:   CBC Latest Ref Rng & Units 10/03/2019 10/02/2019 10/01/2019  WBC 4.0 - 10.5 K/uL 8.0 8.6 7.1  Hemoglobin 12.0 - 15.0 g/dL 94.3 11.8(L) 11.8(L)  Hematocrit 36 - 46 % 38.3 37.1 36.5  Platelets 150 - 400 K/uL 560(H) 540(H) 478(H)   BMP Latest Ref Rng & Units 10/03/2019 10/02/2019 10/01/2019  Glucose 70 - 99 mg/dL 276(D) 470(L) 295(F)  BUN 6 - 20 mg/dL 19 19 15   Creatinine 0.44 - 1.00 mg/dL 4.73 4.03  Sodium 135 - 145 mmol/L 137 137 137  Potassium 3.5 - 5.1 mmol/L 4.4 3.9 4.4  Chloride 98 - 111 mmol/L 102 101 103  CO2 22 - 32 mmol/L 25 27 25   Calcium 8.9 - 10.3 mg/dL 7.09) 8.3(L) 8.6(L)   CT ANGIOGRAPHY CHEST WITH  CONTRAST  FINDINGS: Cardiovascular: The pulmonary arteries are well opacified with contrast to the level of the subsegmental branches. There is no evidence of acute pulmonary embolism. No systemic arterial abnormalities are identified. The heart size is normal. There is no pericardial effusion.  Mediastinum/Nodes: There are no enlarged mediastinal, hilar or axillary lymph nodes. The thyroid gland, trachea and esophagus demonstrate no significant findings.  Lungs/Pleura: No pleural effusion or pneumothorax. As demonstrated on previous radiographs, there are extensive bilateral airspace and ground-glass opacities with lower lobe consolidation and air bronchograms. These have significantly worsened from the abdominal CT of 4 days ago.  Upper abdomen:  The visualized upper abdomen appears unremarkable.  Musculoskeletal/Chest wall: There is no chest wall mass or suspicious osseous finding.  Review of the MIP images confirms the above findings.  IMPRESSION: 1. No evidence of acute pulmonary embolism. 2. Progressive extensive bilateral airspace and ground-glass opacities with lower lobe consolidation and air bronchograms consistent with multilobar pneumonia. Patient had a recent positive SARS coronavirus 2 test. The current lower lobe consolidation suggests possible superimposed bacterial pneumonia or aspiration.  Discharge Instructions: Discharge Instructions    Call MD for:  difficulty breathing, headache or visual disturbances   Complete by: As directed    Call MD for:  persistant dizziness or light-headedness   Complete by: As directed    Call MD for:  persistant nausea and vomiting   Complete by: As directed    Call MD for:  severe uncontrolled pain   Complete by: As directed    Diet Carb Modified   Complete by: As directed    Discharge instructions   Complete by: As directed    Dear Ashmead  You came to 6.4(R with shortness of breath. We have determined  this was caused by COVID pneumonia. Here are our recommendations for you at discharge:  Please take your dexamethasone 6mg  daily for 5 more days Use oxygen as needed if you feel short of breath Feel free to call Post-COVID care center at [phone] for further assistance for your post-COVID symptoms  Thank you for choosing [facility]   Increase activity slowly   Complete by: As directed      Signed: [place], MD 10/05/2019, 11:20 AM Pager: [phone] After 5pm on weekdays and 1pm on weekends: On Call Pager: [phone]

## 2019-10-03 NOTE — Progress Notes (Signed)
Pt expressed concerns with possible d/c 10-03-19. Pt that's she would not feel comfortable going home without oxygen. Pt expressed also concerned because her family is 6 hours away and would like to be/feel prepared to discharge. This RN supportively listened and encouraged patient to discuss further with MD.  Patient laid in the prone position for about 1.5 hours and tolerated well.   Corky Crafts, RN

## 2019-10-03 NOTE — Discharge Instructions (Signed)
10 Things You Can Do to Manage Your COVID-19 Symptoms at Home If you have possible or confirmed COVID-19: 1. Stay home from work and school. And stay away from other public places. If you must go out, avoid using any kind of public transportation, ridesharing, or taxis. 2. Monitor your symptoms carefully. If your symptoms get worse, call your healthcare provider immediately. 3. Get rest and stay hydrated. 4. If you have a medical appointment, call the healthcare provider ahead of time and tell them that you have or may have COVID-19. 5. For medical emergencies, call 911 and notify the dispatch personnel that you have or may have COVID-19. 6. Cover your cough and sneezes with a tissue or use the inside of your elbow. 7. Wash your hands often with soap and water for at least 20 seconds or clean your hands with an alcohol-based hand sanitizer that contains at least 60% alcohol. 8. As much as possible, stay in a specific room and away from other people in your home. Also, you should use a separate bathroom, if available. If you need to be around other people in or outside of the home, wear a mask. 9. Avoid sharing personal items with other people in your household, like dishes, towels, and bedding. 10. Clean all surfaces that are touched often, like counters, tabletops, and doorknobs. Use household cleaning sprays or wipes according to the label instructions. cdc.gov/coronavirus 08/21/2018 This information is not intended to replace advice given to you by your health care provider. Make sure you discuss any questions you have with your health care provider. Document Revised: 01/23/2019 Document Reviewed: 01/23/2019 Elsevier Patient Education  2020 [employer]vier Inc.  

## 2019-10-03 NOTE — TOC Transition Note (Signed)
Transition of Care [facility]) - CM/SW Discharge Note   Patient Details  Name: Susan Fuentes MRN: [id number] Date of Birth: September 01, 1985  [facility] CM/SW Contact:  Bess Kinds, RN Phone Number: [phone] 10/03/2019, 3:35 PM   Clinical Narrative:     Notified by nursing of need for DME oxygen. DME order in place. Clinical note demonstrating medical necessity written. Referral to [employer] to deliver oxygen to the room. No further TOC needs identfied.   Final next level of care: Home/Self Care Barriers to Discharge: No Barriers Identified   Patient Goals and CMS Choice        Discharge Placement                       Discharge Plan and Services                DME Arranged: Oxygen DME Agency: [employer] Date DME Agency Contacted: 10/03/19 Time DME Agency Contacted: 1534 Representative spoke with at DME Agency: Ian Malkin HH Arranged: NA HH Agency: NA        Social Determinants of Health (SDOH) Interventions     Readmission Risk Interventions No flowsheet data found.

## 2019-10-06 LAB — I-STAT BETA HCG BLOOD, ED (MC, WL, AP ONLY): I-stat hCG, quantitative: <5 m[IU]/mL (ref <5)

## 2021-09-18 IMAGING — CT CT ABD-PELV W/ CM
2 of 4 series · 16 of 46 positions shown, 18 images · IV contrast (OMNIPAQUE 300)
Comparison: 11/08/2017

CLINICAL DATA: Right lower quadrant a abdominal pain with suspected
appendicitis. Positive COVID

EXAM:
CT ABDOMEN AND PELVIS WITH CONTRAST
TECHNIQUE: Multidetector CT imaging of the abdomen and pelvis was performed
using the standard protocol following bolus administration of
intravenous contrast.
CONTRAST:  100mL OMNIPAQUE IOHEXOL 300 MG/ML  SOLN

[Series 2: axial st · axial · 0.64mm/px · z∈[+1188,+1548]mm · 13 of 81 slices shown, 15 images]
[im 5/81  soft-tissue]
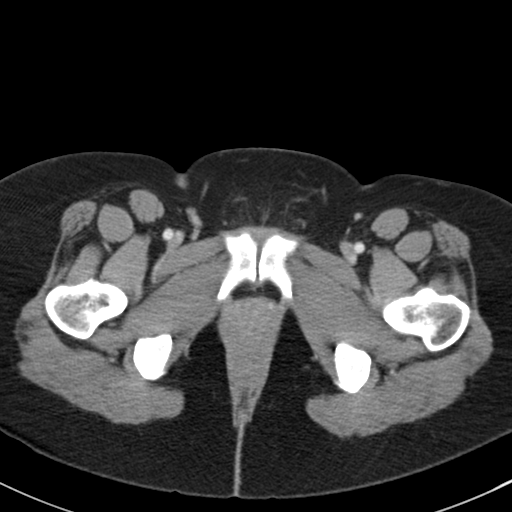
[im 5/81  bone]
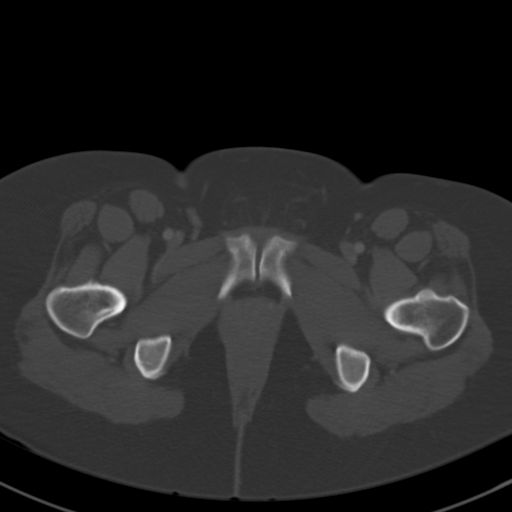
[im 13/81  soft-tissue]
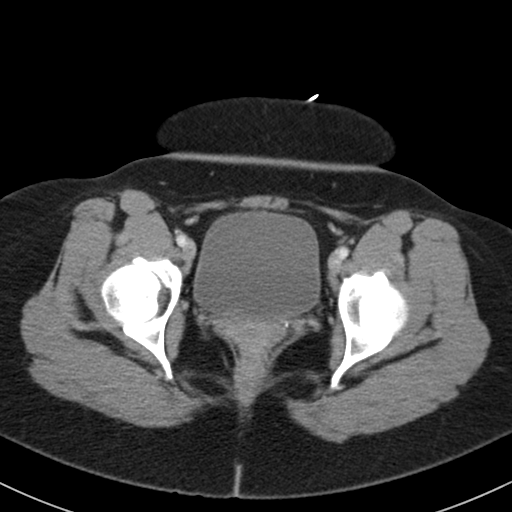
[im 17/81  soft-tissue]
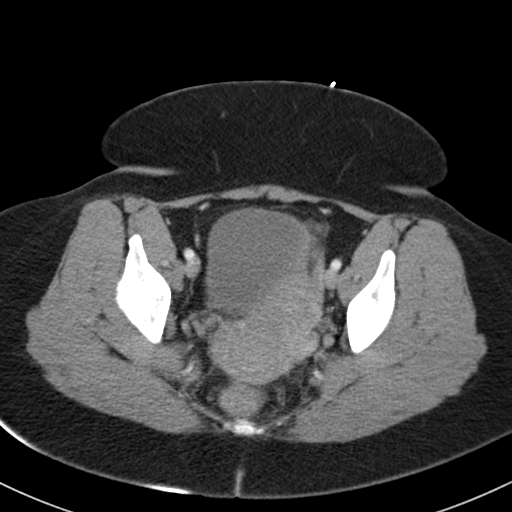
[im 25/81  soft-tissue]
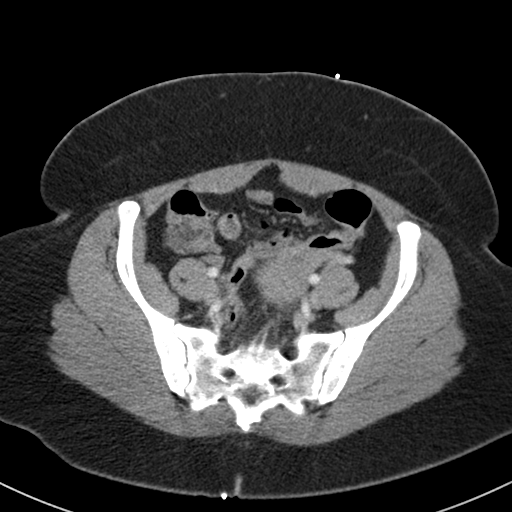
[im 29/81  soft-tissue]
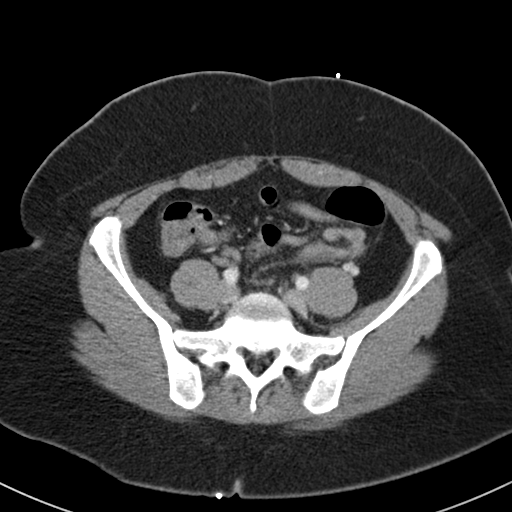
[im 37/81  soft-tissue]
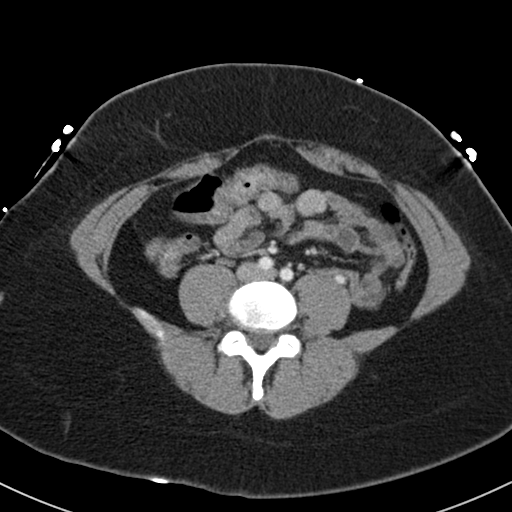
[im 41/81  soft-tissue]
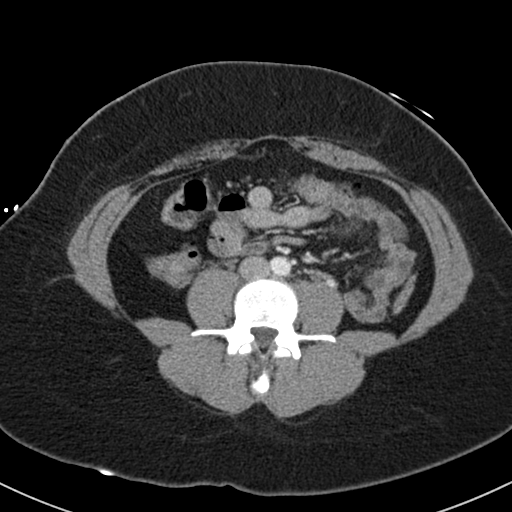
[im 45/81  soft-tissue]
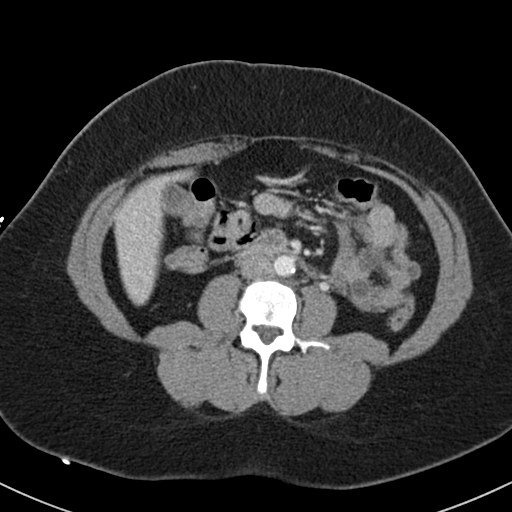
[im 53/81  soft-tissue]
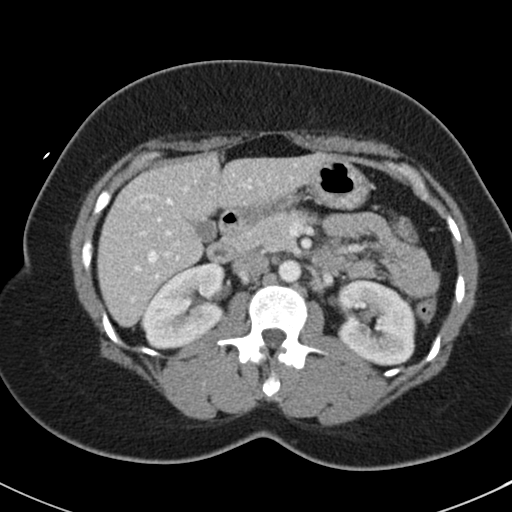
[im 53/81  bone]
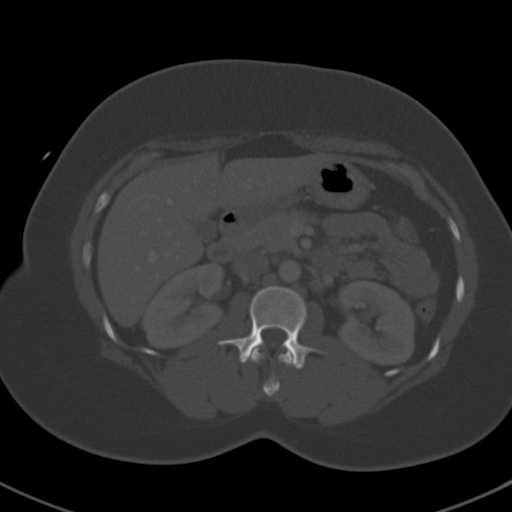
[im 57/81  soft-tissue]
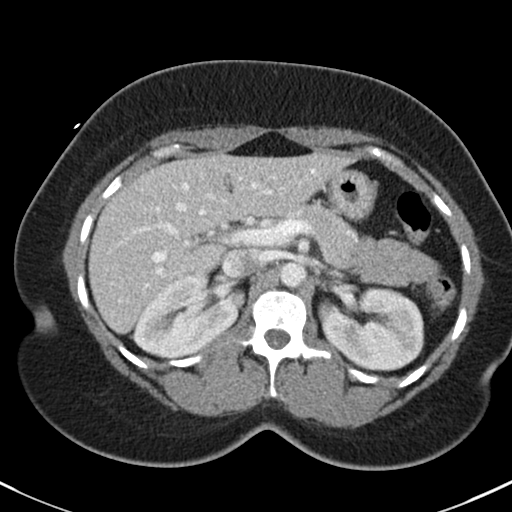
[im 65/81  soft-tissue]
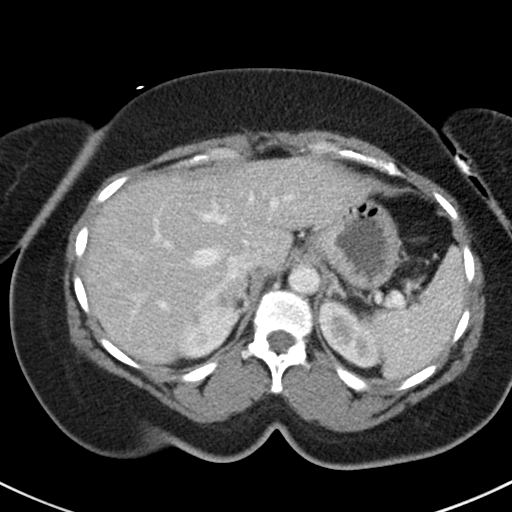
[im 69/81  soft-tissue]
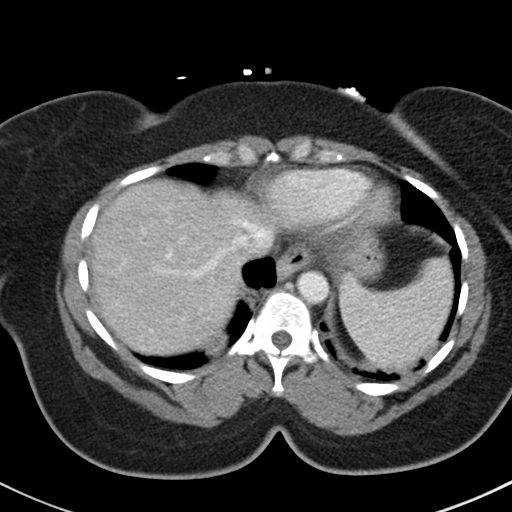
[im 77/81  soft-tissue]
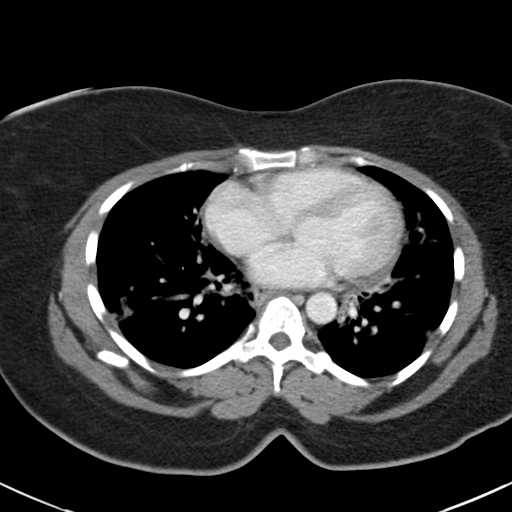

[Series 4: coronal st · coronal · 0.67mm/px · 3 of 135 slices shown]
[im 45/135  soft-tissue]
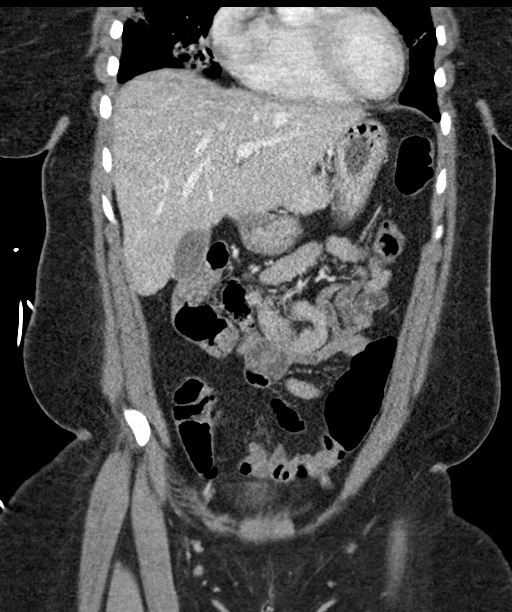
[im 60/135  soft-tissue]
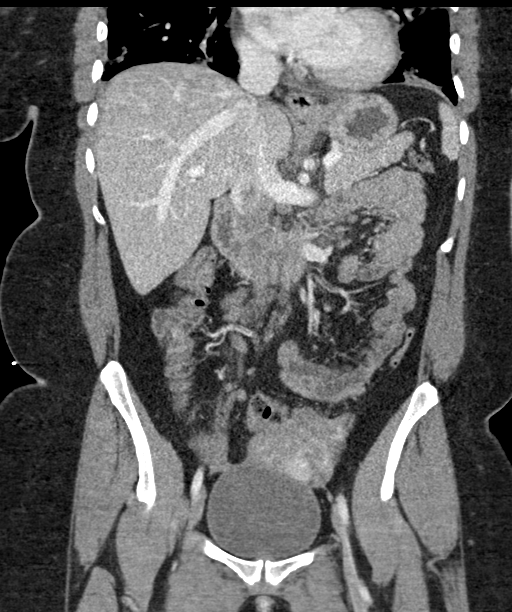
[im 75/135  soft-tissue]
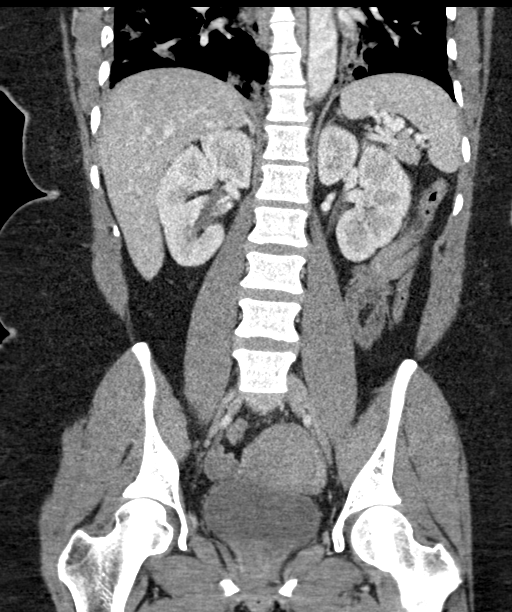

[16 of 46 positions shown; findings below may reference images not displayed]

FINDINGS: Lower chest: Lung bases demonstrate nodular airspace infiltrates
with air bronchograms compatible with COVID pneumonia. No pleural
effusions.

Hepatobiliary: Diffuse fatty infiltration of the liver. Gallbladder
and bile ducts are unremarkable.

Pancreas: Unremarkable. No pancreatic ductal dilatation or
surrounding inflammatory changes.

Spleen: Normal in size without focal abnormality.

Adrenals/Urinary Tract: Adrenal glands are unremarkable. Kidneys are
normal, without renal calculi, focal lesion, or hydronephrosis.
Bladder is unremarkable.

Stomach/Bowel: The stomach, small bowel, and colon are not
abnormally distended. No wall thickening or inflammatory changes are
appreciated. The appendix is normal.

Vascular/Lymphatic: No significant vascular findings are present. No
enlarged abdominal or pelvic lymph nodes.

Reproductive: Uterus is anteverted. Small fibroid off of the uterine
fundus. No change. No abnormal adnexal masses.

Other: No abdominal wall hernia or abnormality. No abdominopelvic
ascites.

Musculoskeletal: No acute or significant osseous findings.
IMPRESSION: 1. No acute process demonstrated in the abdomen or pelvis. No
evidence of bowel obstruction or inflammation. Appendix is normal.
2. Diffuse fatty infiltration of the liver.
3. Lung bases demonstrate nodular airspace infiltrates with air
bronchograms compatible with COVID pneumonia.
